# Patient Record
Sex: Male | Born: 1945 | Race: White | Hispanic: No | Marital: Married | Smoking: Former smoker
Health system: Southern US, Community
[De-identification: ages and names within clinical notes are randomized; demographics above are authoritative.]

## PROBLEM LIST (undated history)

## (undated) DIAGNOSIS — I213 ST elevation (STEMI) myocardial infarction of unspecified site: Secondary | ICD-10-CM

## (undated) DIAGNOSIS — R0602 Shortness of breath: Secondary | ICD-10-CM

## (undated) DIAGNOSIS — K5900 Constipation, unspecified: Secondary | ICD-10-CM

## (undated) DIAGNOSIS — M1990 Unspecified osteoarthritis, unspecified site: Secondary | ICD-10-CM

## (undated) DIAGNOSIS — R918 Other nonspecific abnormal finding of lung field: Secondary | ICD-10-CM

## (undated) DIAGNOSIS — I10 Essential (primary) hypertension: Secondary | ICD-10-CM

## (undated) DIAGNOSIS — Z87891 Personal history of nicotine dependence: Secondary | ICD-10-CM

## (undated) DIAGNOSIS — M545 Low back pain, unspecified: Secondary | ICD-10-CM

## (undated) DIAGNOSIS — R05 Cough: Secondary | ICD-10-CM

## (undated) DIAGNOSIS — I219 Acute myocardial infarction, unspecified: Secondary | ICD-10-CM

## (undated) DIAGNOSIS — J439 Emphysema, unspecified: Secondary | ICD-10-CM

## (undated) DIAGNOSIS — J449 Chronic obstructive pulmonary disease, unspecified: Secondary | ICD-10-CM

## (undated) DIAGNOSIS — E785 Hyperlipidemia, unspecified: Secondary | ICD-10-CM

## (undated) DIAGNOSIS — Z9889 Other specified postprocedural states: Secondary | ICD-10-CM

## (undated) DIAGNOSIS — R059 Cough, unspecified: Secondary | ICD-10-CM

## (undated) HISTORY — DX: ST elevation (STEMI) myocardial infarction of unspecified site: I21.3

## (undated) HISTORY — DX: Cough: R05

## (undated) HISTORY — DX: Cough, unspecified: R05.9

## (undated) HISTORY — PX: CARDIAC CATHETERIZATION: SHX172

## (undated) HISTORY — DX: Low back pain: M54.5

## (undated) HISTORY — PX: CARDIAC SURGERY: SHX584

## (undated) HISTORY — DX: Constipation, unspecified: K59.00

## (undated) HISTORY — DX: Other nonspecific abnormal finding of lung field: R91.8

## (undated) HISTORY — DX: Other specified postprocedural states: Z98.890

## (undated) HISTORY — DX: Personal history of nicotine dependence: Z87.891

## (undated) HISTORY — DX: Low back pain, unspecified: M54.50

## (undated) HISTORY — DX: Unspecified osteoarthritis, unspecified site: M19.90

## (undated) HISTORY — DX: Shortness of breath: R06.02

## (undated) HISTORY — DX: Chronic obstructive pulmonary disease, unspecified: J44.9

## (undated) HISTORY — DX: Emphysema, unspecified: J43.9

---

## 2013-10-04 ENCOUNTER — Ambulatory Visit: Payer: Self-pay | Admitting: Family Medicine

## 2014-01-13 DIAGNOSIS — Z9889 Other specified postprocedural states: Secondary | ICD-10-CM | POA: Insufficient documentation

## 2015-05-17 ENCOUNTER — Ambulatory Visit (INDEPENDENT_AMBULATORY_CARE_PROVIDER_SITE_OTHER): Payer: BLUE CROSS/BLUE SHIELD

## 2015-05-17 ENCOUNTER — Ambulatory Visit: Payer: BLUE CROSS/BLUE SHIELD

## 2015-05-17 ENCOUNTER — Encounter: Payer: Self-pay | Admitting: *Deleted

## 2015-05-17 ENCOUNTER — Ambulatory Visit
Admission: EM | Admit: 2015-05-17 | Discharge: 2015-05-17 | Disposition: A | Discharge status: Home / Self Care | Payer: BLUE CROSS/BLUE SHIELD | Attending: Family Medicine | Admitting: Family Medicine

## 2015-05-17 DIAGNOSIS — J984 Other disorders of lung: Secondary | ICD-10-CM

## 2015-05-17 DIAGNOSIS — I252 Old myocardial infarction: Secondary | ICD-10-CM | POA: Insufficient documentation

## 2015-05-17 DIAGNOSIS — M5442 Lumbago with sciatica, left side: Secondary | ICD-10-CM | POA: Insufficient documentation

## 2015-05-17 DIAGNOSIS — Z7982 Long term (current) use of aspirin: Secondary | ICD-10-CM | POA: Insufficient documentation

## 2015-05-17 DIAGNOSIS — R042 Hemoptysis: Secondary | ICD-10-CM

## 2015-05-17 DIAGNOSIS — Z79899 Other long term (current) drug therapy: Secondary | ICD-10-CM | POA: Diagnosis not present

## 2015-05-17 DIAGNOSIS — R918 Other nonspecific abnormal finding of lung field: Secondary | ICD-10-CM | POA: Diagnosis not present

## 2015-05-17 DIAGNOSIS — I10 Essential (primary) hypertension: Secondary | ICD-10-CM | POA: Diagnosis not present

## 2015-05-17 DIAGNOSIS — R079 Chest pain, unspecified: Secondary | ICD-10-CM | POA: Diagnosis present

## 2015-05-17 DIAGNOSIS — E785 Hyperlipidemia, unspecified: Secondary | ICD-10-CM | POA: Insufficient documentation

## 2015-05-17 DIAGNOSIS — M5441 Lumbago with sciatica, right side: Secondary | ICD-10-CM

## 2015-05-17 DIAGNOSIS — M549 Dorsalgia, unspecified: Secondary | ICD-10-CM | POA: Diagnosis present

## 2015-05-17 DIAGNOSIS — Z87891 Personal history of nicotine dependence: Secondary | ICD-10-CM | POA: Insufficient documentation

## 2015-05-17 HISTORY — DX: Essential (primary) hypertension: I10

## 2015-05-17 HISTORY — DX: Hyperlipidemia, unspecified: E78.5

## 2015-05-17 HISTORY — DX: Acute myocardial infarction, unspecified: I21.9

## 2015-05-17 MED ORDER — CYCLOBENZAPRINE HCL 10 MG PO TABS: 10.0000 mg | ORAL_TABLET | Freq: Every day | ORAL | Status: DC

## 2015-05-17 MED ORDER — HYDROCODONE-ACETAMINOPHEN 5-325 MG PO TABS: ORAL_TABLET | ORAL | Status: DC

## 2015-05-17 MED ORDER — CLONIDINE HCL 0.2 MG PO TABS
0.2000 mg | ORAL_TABLET | Freq: Once | ORAL | Status: AC
  Administered 2015-05-17: 0.2 mg via ORAL

## 2015-05-17 NOTE — ED Notes (Signed)
Patient scheduled to see Dr. Raul Del, Pulmonologist, on Feb. 23 Thursday at 10:15am at [facility].

## 2015-05-17 NOTE — ED Notes (Signed)
Patient started having symptoms of lower back pain 3 weeks ago and chest pain occurred last PM. Additional symptoms of nausea and vomiting occurred this AM. Back pain occurred while at work, but patient did not report the incident.

## 2015-05-17 NOTE — ED Provider Notes (Signed)
CSN: [id number]     Arrival date & time 05/17/15  40 History   First MD Initiated Contact with Patient 05/17/15 1117     Chief Complaint  Patient presents with  . Back Pain  . Nausea  . Chest Pain   (Consider location/radiation/quality/duration/timing/severity/associated sxs/prior Treatment) HPI Comments: 70 yo male with a c/o low back pain for 3 weeks, right sided chest pain last night associated with movement and vomiting this morning. Also states that for the past 5 months he's had a chronic cough with intermittent coughing up of small amounts of blood. States quit smoking around that time, but had a 50 year h/o smoking. Denies any fevers, chills, shortness of breath, palpitations.  The history is provided by the patient.    Past Medical History  Diagnosis Date  . Hypertension   . MI (myocardial infarction) ([place])     x2  . Hyperlipidemia    Past Surgical History  Procedure Laterality Date  . Cardiac surgery      stents placed x2   History reviewed. No pertinent family history. Social History  Substance Use Topics  . Smoking status: Former Research scientist (life sciences)  . Smokeless tobacco: Never Used  . Alcohol Use: No    Review of Systems  Allergies  Lisinopril  Home Medications   Prior to Admission medications   Medication Sig Start Date End Date Taking? Authorizing Provider  aspirin 81 MG tablet Take 81 mg by mouth daily.   Yes Historical Provider, MD  losartan (COZAAR) 50 MG tablet Take 50 mg by mouth daily.   Yes Historical Provider, MD  metoprolol (LOPRESSOR) 50 MG tablet Take 50 mg by mouth 2 (two) times daily.   Yes Historical Provider, MD  cyclobenzaprine (FLEXERIL) 10 MG tablet Take 1 tablet (10 mg total) by mouth at bedtime. 05/17/15   Norval Gable, MD  HYDROcodone-acetaminophen (NORCO/VICODIN) 5-325 MG tablet 1-2 tabs po  q 8 hours prn pain 05/17/15   Norval Gable, MD   Meds Ordered and Administered this Visit   Medications  cloNIDine (CATAPRES) tablet 0.2 mg (0.2 mg  Oral Given 05/17/15 1231)    BP 168/88 mmHg  Pulse 74  Temp(Src) 97.9 F (36.6 C) (Oral)  Resp 18  Ht 6' (1.829 m)  Wt 175 lb (79.379 kg)  BMI 23.73 kg/m2  SpO2 99% No data found.   Physical Exam  Constitutional: He appears well-developed and well-nourished. No distress.  HENT:  Head: Normocephalic and atraumatic.  Right Ear: External ear normal.  Left Ear: External ear normal.  Nose: Nose normal.  Mouth/Throat: Uvula is midline, oropharynx is clear and moist and mucous membranes are normal. No oropharyngeal exudate or tonsillar abscesses.  Eyes: Conjunctivae and EOM are normal. Pupils are equal, round, and reactive to light. Right eye exhibits no discharge. Left eye exhibits no discharge. No scleral icterus.  Neck: Normal range of motion. Neck supple. No tracheal deviation present. No thyromegaly present.  Cardiovascular: Normal rate, regular rhythm and normal heart sounds.   Pulmonary/Chest: Effort normal and breath sounds normal. No stridor. No respiratory distress. He has no wheezes. He has no rales. He exhibits tenderness.  Musculoskeletal:       Lumbar back: He exhibits tenderness and bony tenderness. He exhibits no swelling, no edema, no deformity, no laceration, no spasm and normal pulse.  Lymphadenopathy:    He has no cervical adenopathy.  Neurological: He is alert.  Skin: Skin is warm and dry. No rash noted. He is not diaphoretic.  Nursing note  and vitals reviewed.   ED Course  Procedures (including critical care time)  Labs Review Labs Reviewed - No data to display  Imaging Review Dg Chest 2 View  05/17/2015  CLINICAL DATA:  71 year old male with productive cough and hemoptysis since September 2016. EXAM: CHEST  2 VIEW COMPARISON:  None. FINDINGS: Prominent chronic obstructive pulmonary changes. Masslike consolidation right middle lobe. Malignancy is of concern. CT chest recommended for further delineation. Mild degenerative changes lower thoracic spine.  IMPRESSION: Prominent chronic obstructive pulmonary changes. Masslike consolidation right middle lobe. Malignancy is of concern. CT chest recommended for further delineation. These results will be called to the ordering clinician or representative by the Radiologist Assistant, and communication documented in the PACS or zVision Dashboard. Electronically Signed   By: Genia Del M.D.   On: 05/17/2015 12:36   Dg Lumbar Spine Complete  05/17/2015  CLINICAL DATA:  70 year old male with low back pain left side extending into both legs. Initial encounter. EXAM: LUMBAR SPINE - COMPLETE 4+ VIEW COMPARISON:  None. FINDINGS: Straightening of the lumbar spine. Mild disc space narrowing and minimal Schmorl's node deformity L1-2 and L2-3. L3-4 facet degenerative changes. Minimal anterior slip L3. Minimal disc space narrowing and Schmorl's node deformity L3-4. Marked L4-5 disc space narrowing with broad-based osteophyte. Facet degenerative changes. L5-S1 moderate to marked disc space narrowing with broad-based osteophyte. Facet degenerative changes. Vascular calcifications. IMPRESSION: Marked L4-5 disc space narrowing with broad-based osteophyte. Facet degenerative changes. L5-S1 moderate to marked disc space narrowing with broad-based osteophyte. Facet degenerative changes. L3-4 facet degenerative changes. Minimal anterior slip L3. Minimal disc space narrowing and Schmorl's node deformity L3-4. Mild disc space narrowing and minimal Schmorl's node deformity L1-2 and L2-3. Electronically Signed   By: Genia Del M.D.   On: 05/17/2015 12:13     Visual Acuity Review  Right Eye Distance:   Left Eye Distance:   Bilateral Distance:    Right Eye Near:   Left Eye Near:    Bilateral Near:      EKG: normal EKG, normal sinus rhythm, there are no previous tracings available for comparison; reviewed by me and agree.  MDM   1. Bilateral low back pain with sciatica, sciatica laterality unspecified   2. Hemoptysis    3. Mass of middle lobe of right lung    Discharge Medication List as of 05/17/2015  1:20 PM    START taking these medications   Details  cyclobenzaprine (FLEXERIL) 10 MG tablet Take 1 tablet (10 mg total) by mouth at bedtime., Starting 05/17/2015, Until Discontinued, Normal    HYDROcodone-acetaminophen (NORCO/VICODIN) 5-325 MG tablet 1-2 tabs po  q 8 hours prn pain, Print       1. x-ray results and diagnosis reviewed with patient 2. rx as per orders above; reviewed possible side effects, interactions, risks and benefits  3. Follow-up with pulmonologist (appointment scheduled for this week, Thursday)   Norval Gable, MD 05/17/15 2134

## 2015-05-20 ENCOUNTER — Other Ambulatory Visit: Payer: Self-pay | Admitting: Specialist

## 2015-05-20 DIAGNOSIS — R918 Other nonspecific abnormal finding of lung field: Secondary | ICD-10-CM

## 2015-05-24 ENCOUNTER — Ambulatory Visit
Admission: RE | Admit: 2015-05-24 | Discharge: 2015-05-24 | Disposition: A | Discharge status: Home / Self Care | Payer: BLUE CROSS/BLUE SHIELD | Source: Ambulatory Visit | Attending: Specialist | Admitting: Specialist

## 2015-05-24 DIAGNOSIS — R918 Other nonspecific abnormal finding of lung field: Secondary | ICD-10-CM

## 2015-05-24 DIAGNOSIS — R0602 Shortness of breath: Secondary | ICD-10-CM | POA: Diagnosis present

## 2015-05-24 DIAGNOSIS — J90 Pleural effusion, not elsewhere classified: Secondary | ICD-10-CM | POA: Insufficient documentation

## 2015-05-24 DIAGNOSIS — J438 Other emphysema: Secondary | ICD-10-CM | POA: Insufficient documentation

## 2015-05-24 DIAGNOSIS — E279 Disorder of adrenal gland, unspecified: Secondary | ICD-10-CM | POA: Insufficient documentation

## 2015-05-27 ENCOUNTER — Other Ambulatory Visit: Payer: Self-pay | Admitting: Specialist

## 2015-05-27 DIAGNOSIS — R918 Other nonspecific abnormal finding of lung field: Secondary | ICD-10-CM

## 2015-05-31 ENCOUNTER — Ambulatory Visit: Admission: RE | Admit: 2015-05-31 | Payer: 59 | Source: Ambulatory Visit

## 2015-06-02 ENCOUNTER — Ambulatory Visit: Admission: RE | Admit: 2015-06-02 | Payer: 59 | Source: Ambulatory Visit

## 2015-06-03 ENCOUNTER — Ambulatory Visit: Payer: BLUE CROSS/BLUE SHIELD | Admitting: Cardiothoracic Surgery

## 2015-06-03 ENCOUNTER — Inpatient Hospital Stay: Payer: 59 | Admitting: Internal Medicine

## 2015-06-04 ENCOUNTER — Ambulatory Visit
Admission: RE | Admit: 2015-06-04 | Discharge: 2015-06-04 | Disposition: A | Discharge status: Home / Self Care | Payer: 59 | Source: Ambulatory Visit | Attending: Specialist | Admitting: Specialist

## 2015-06-04 ENCOUNTER — Ambulatory Visit: Payer: BLUE CROSS/BLUE SHIELD

## 2015-06-04 DIAGNOSIS — C7951 Secondary malignant neoplasm of bone: Secondary | ICD-10-CM | POA: Diagnosis not present

## 2015-06-04 DIAGNOSIS — R918 Other nonspecific abnormal finding of lung field: Secondary | ICD-10-CM | POA: Diagnosis present

## 2015-06-04 DIAGNOSIS — C3490 Malignant neoplasm of unspecified part of unspecified bronchus or lung: Secondary | ICD-10-CM | POA: Insufficient documentation

## 2015-06-04 DIAGNOSIS — J90 Pleural effusion, not elsewhere classified: Secondary | ICD-10-CM | POA: Insufficient documentation

## 2015-06-04 DIAGNOSIS — I2510 Atherosclerotic heart disease of native coronary artery without angina pectoris: Secondary | ICD-10-CM | POA: Diagnosis not present

## 2015-06-04 LAB — GLUCOSE, CAPILLARY: GLUCOSE-CAPILLARY: 121 mg/dL — AB (ref 65–99)

## 2015-06-04 MED ORDER — FLUDEOXYGLUCOSE F - 18 (FDG) INJECTION
12.5800 | Freq: Once | INTRAVENOUS | Status: AC | PRN
  Administered 2015-06-04: 12.58 via INTRAVENOUS

## 2015-06-09 ENCOUNTER — Encounter: Payer: Self-pay | Admitting: *Deleted

## 2015-06-09 DIAGNOSIS — Z720 Tobacco use: Secondary | ICD-10-CM | POA: Insufficient documentation

## 2015-06-09 DIAGNOSIS — I10 Essential (primary) hypertension: Secondary | ICD-10-CM | POA: Insufficient documentation

## 2015-06-10 ENCOUNTER — Inpatient Hospital Stay: Payer: 59 | Attending: Internal Medicine | Admitting: Internal Medicine

## 2015-06-10 ENCOUNTER — Inpatient Hospital Stay: Payer: 59

## 2015-06-10 ENCOUNTER — Encounter: Payer: Self-pay | Admitting: Internal Medicine

## 2015-06-10 VITALS — BP 146/78 | HR 99 | Temp 98.6°F | Resp 24 | Ht 72.0 in | Wt 161.9 lb

## 2015-06-10 DIAGNOSIS — G4700 Insomnia, unspecified: Secondary | ICD-10-CM | POA: Diagnosis not present

## 2015-06-10 DIAGNOSIS — R200 Anesthesia of skin: Secondary | ICD-10-CM | POA: Insufficient documentation

## 2015-06-10 DIAGNOSIS — I252 Old myocardial infarction: Secondary | ICD-10-CM | POA: Diagnosis not present

## 2015-06-10 DIAGNOSIS — E279 Disorder of adrenal gland, unspecified: Secondary | ICD-10-CM | POA: Insufficient documentation

## 2015-06-10 DIAGNOSIS — J449 Chronic obstructive pulmonary disease, unspecified: Secondary | ICD-10-CM | POA: Diagnosis not present

## 2015-06-10 DIAGNOSIS — J90 Pleural effusion, not elsewhere classified: Secondary | ICD-10-CM

## 2015-06-10 DIAGNOSIS — C7931 Secondary malignant neoplasm of brain: Secondary | ICD-10-CM | POA: Insufficient documentation

## 2015-06-10 DIAGNOSIS — Z87891 Personal history of nicotine dependence: Secondary | ICD-10-CM | POA: Insufficient documentation

## 2015-06-10 DIAGNOSIS — M25552 Pain in left hip: Secondary | ICD-10-CM | POA: Insufficient documentation

## 2015-06-10 DIAGNOSIS — C3431 Malignant neoplasm of lower lobe, right bronchus or lung: Secondary | ICD-10-CM | POA: Diagnosis present

## 2015-06-10 DIAGNOSIS — Z79899 Other long term (current) drug therapy: Secondary | ICD-10-CM | POA: Insufficient documentation

## 2015-06-10 DIAGNOSIS — R042 Hemoptysis: Secondary | ICD-10-CM | POA: Insufficient documentation

## 2015-06-10 DIAGNOSIS — E785 Hyperlipidemia, unspecified: Secondary | ICD-10-CM | POA: Insufficient documentation

## 2015-06-10 DIAGNOSIS — R202 Paresthesia of skin: Secondary | ICD-10-CM | POA: Insufficient documentation

## 2015-06-10 DIAGNOSIS — R0602 Shortness of breath: Secondary | ICD-10-CM | POA: Insufficient documentation

## 2015-06-10 DIAGNOSIS — R918 Other nonspecific abnormal finding of lung field: Secondary | ICD-10-CM | POA: Diagnosis not present

## 2015-06-10 DIAGNOSIS — I10 Essential (primary) hypertension: Secondary | ICD-10-CM | POA: Insufficient documentation

## 2015-06-10 DIAGNOSIS — C7951 Secondary malignant neoplasm of bone: Secondary | ICD-10-CM | POA: Diagnosis not present

## 2015-06-10 DIAGNOSIS — Z7982 Long term (current) use of aspirin: Secondary | ICD-10-CM | POA: Insufficient documentation

## 2015-06-10 DIAGNOSIS — K5900 Constipation, unspecified: Secondary | ICD-10-CM | POA: Insufficient documentation

## 2015-06-10 DIAGNOSIS — I2510 Atherosclerotic heart disease of native coronary artery without angina pectoris: Secondary | ICD-10-CM | POA: Diagnosis not present

## 2015-06-10 DIAGNOSIS — G893 Neoplasm related pain (acute) (chronic): Secondary | ICD-10-CM | POA: Insufficient documentation

## 2015-06-10 DIAGNOSIS — M899 Disorder of bone, unspecified: Secondary | ICD-10-CM | POA: Insufficient documentation

## 2015-06-10 DIAGNOSIS — C7989 Secondary malignant neoplasm of other specified sites: Secondary | ICD-10-CM | POA: Diagnosis not present

## 2015-06-10 LAB — CBC WITH DIFFERENTIAL/PLATELET
BASOS PCT: 0 %
Basophils Absolute: 0 10*3/uL (ref 0–0.1)
Eosinophils Absolute: 1.5 10*3/uL — ABNORMAL HIGH (ref 0–0.7)
Eosinophils Relative: 4 %
HEMATOCRIT: 40.4 % (ref 40.0–52.0)
HEMOGLOBIN: 13 g/dL (ref 13.0–18.0)
LYMPHS ABS: 2.5 10*3/uL (ref 1.0–3.6)
LYMPHS PCT: 7 %
MCH: 29.5 pg (ref 26.0–34.0)
MCHC: 32.2 g/dL (ref 32.0–36.0)
MCV: 91.4 fL (ref 80.0–100.0)
MONOS PCT: 7 %
Monocytes Absolute: 2.5 10*3/uL — ABNORMAL HIGH (ref 0.2–1.0)
NEUTROS ABS: 29.9 10*3/uL — AB (ref 1.4–6.5)
Neutrophils Relative %: 82 %
Platelets: 459 10*3/uL — ABNORMAL HIGH (ref 150–440)
RBC: 4.41 MIL/uL (ref 4.40–5.90)
RDW: 14.9 % — AB (ref 11.5–14.5)
WBC: 36.4 10*3/uL — ABNORMAL HIGH (ref 3.8–10.6)

## 2015-06-10 LAB — APTT: aPTT: 27 seconds (ref 24–36)

## 2015-06-10 LAB — COMPREHENSIVE METABOLIC PANEL
ALBUMIN: 3.1 g/dL — AB (ref 3.5–5.0)
ALK PHOS: 98 U/L (ref 38–126)
ALT: 19 U/L (ref 17–63)
ANION GAP: 9 (ref 5–15)
AST: 16 U/L (ref 15–41)
BILIRUBIN TOTAL: 0.4 mg/dL (ref 0.3–1.2)
BUN: 23 mg/dL — AB (ref 6–20)
CO2: 26 mmol/L (ref 22–32)
Calcium: 9 mg/dL (ref 8.9–10.3)
Chloride: 98 mmol/L — ABNORMAL LOW (ref 101–111)
Creatinine, Ser: 1.08 mg/dL (ref 0.61–1.24)
GFR calc Af Amer: >60 mL/min (ref >60)
GFR calc non Af Amer: >60 mL/min (ref >60)
GLUCOSE: 94 mg/dL (ref 65–99)
POTASSIUM: 4.1 mmol/L (ref 3.5–5.1)
SODIUM: 133 mmol/L — AB (ref 135–145)
TOTAL PROTEIN: 7.3 g/dL (ref 6.5–8.1)

## 2015-06-10 LAB — PROTIME-INR
INR: 1.09
Prothrombin Time: 14.3 seconds (ref 11.4–15.0)

## 2015-06-10 LAB — LACTATE DEHYDROGENASE: LDH: 189 U/L (ref 98–192)

## 2015-06-10 MED ORDER — FENTANYL 25 MCG/HR TD PT72: 25.0000 ug | MEDICATED_PATCH | TRANSDERMAL | Status: DC

## 2015-06-10 MED ORDER — HYDROCODONE-ACETAMINOPHEN 5-325 MG PO TABS: ORAL_TABLET | ORAL | Status: DC

## 2015-06-10 NOTE — Progress Notes (Signed)
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  male long-standing history of smoking was recently evaluated in urgent care for shortness of breath- with a chest x-ray showed right lower lobe lung mass. This led to further imaging with a CAT scan that showed/right lower lobe mass with multiple bone metastases/adrenal metastases. He has been referred to [place] for further evaluation and recommendations.  Patient states to have significant pain of his left hip radiating to the leg; patient rates the pain 6-8 a scale of 10.Marland Kitchen Denies any bowel or bladder incontinence. He has constipation./Narcotics. No nausea no vomiting. Poor appetite. No syncope and weight loss. Denies any headaches. Patient has mild tingling and numbness of his left lower extremity.   ROS: A complete 10 point review of system is done which is negative except mentioned above in history of present illness  MEDICAL HISTORY:  Past Medical History  Diagnosis Date  . Hypertension   . MI (myocardial infarction) ([place])     x2 (2012 and 2014) at [facility]  . Hyperlipidemia   . Lung mass   . COPD (chronic obstructive pulmonary disease) ([place])   . Cough   . Emphysema of lung ([place])   . Shortness of breath   . Constipation   . Arthritis   . Low back pain   . ST elevation myocardial infarction (STEMI) ([place])   . History of tobacco abuse   . Hx of cardiac cath     SURGICAL HISTORY: Past Surgical History  Procedure Laterality Date  . Cardiac surgery      stents placed x2    SOCIAL HISTORY: Social History   Social History  . Marital Status: Married    Spouse Name: N/A  . Number of  Children: N/A  . Years of Education: N/A   Occupational History  . Not on file.   Social History Main Topics  . Smoking status: Former Smoker -- 1.00 packs/day for 50 years    Types: Cigarettes  . Smokeless tobacco: Never Used     Comment: "stopped 6 months ago" 2016  . Alcohol Use: No  . Drug Use: No  . Sexual Activity: Not on file   Other Topics Concern  . Not on file   Social History Narrative    FAMILY HISTORY: Family History  Problem Relation Age of Onset  . Hypertension    . Other Brother     "blood disorder"  . Heart failure Mother   . Heart disease Brother   . Heart disease Brother   . Heart disease      ALLERGIES:  is allergic to lisinopril.  MEDICATIONS:  Current Outpatient Prescriptions  Medication Sig Dispense Refill  . albuterol (PROAIR HFA) 108 (90 Base) MCG/ACT inhaler Inhale 2 Inhalers into the lungs every 6 (six) hours as needed. wheezing    . cyclobenzaprine (FLEXERIL) 10 MG tablet Take 1 tablet (10 mg total) by mouth at bedtime. 30 tablet 0  . HYDROcodone-acetaminophen (NORCO/VICODIN) 5-325 MG tablet 1 tab po  q  4-6 hours prn pain 60 tablet 0  . losartan (COZAAR) 50 MG tablet Take 50 mg by mouth daily.    Marland Kitchen  magnesium hydroxide (MILK OF MAGNESIA) 400 MG/5ML suspension Take 5 mLs by mouth daily as needed for mild constipation.    . metoprolol (LOPRESSOR) 50 MG tablet Take 50 mg by mouth 2 (two) times daily.    . NON FORMULARY Take 2 capsules by mouth daily.    Marland Kitchen omega-3 acid ethyl esters (LOVAZA) 1 g capsule Take 1 g by mouth daily.    . polyethylene glycol (MIRALAX / GLYCOLAX) packet Take 17 g by mouth daily as needed for mild constipation.    Marland Kitchen tiotropium (SPIRIVA) 18 MCG inhalation capsule Place 1 capsule into inhaler and inhale daily.    Marland Kitchen aspirin 81 MG tablet Take 81 mg by mouth daily. Reported on 06/10/2015    . fentaNYL (DURAGESIC - DOSED MCG/HR) 25 MCG/HR patch Place 1 patch (25 mcg total) onto the skin every 3 (three) days. 5 patch 0   No  current facility-administered medications for this visit.      Marland Kitchen  PHYSICAL EXAMINATION: ECOG PERFORMANCE STATUS: 1 - Symptomatic but completely ambulatory  Filed Vitals:   06/10/15 1615  BP: 146/78  Pulse: 99  Temp: 98.6 F (37 C)  Resp: 24   Filed Weights   06/10/15 1615  Weight: 161 lb 14.9 oz (73.45 kg)    GENERAL: Well-nourished well-developed; Alert, no distress and comfortable.   Accompanied by multiple family members including wife sister and son EYES: no pallor or icterus OROPHARYNX: no thrush or ulceration; good dentition  NECK: supple, no masses felt LYMPH:  no palpable lymphadenopathy in the cervical, axillary or inguinal regions LUNGS: Decreased breath sounds at the right lower base  No wheeze or crackles HEART/CVS: regular rate & rhythm and no murmurs; No lower extremity edema ABDOMEN: abdomen soft, non-tender and normal bowel sounds Musculoskeletal:no cyanosis of digits and no clubbing  PSYCH: alert & oriented x 3 with fluent speech NEURO: no focal motor/sensory deficits SKIN:  no rashes or significant lesions  LABORATORY DATA:  I have reviewed the data as listed No results found for: WBC, HGB, HCT, MCV, PLT No results for input(s): NA, K, CL, CO2, GLUCOSE, BUN, CREATININE, CALCIUM, GFRNONAA, GFRAA, PROT, ALBUMIN, AST, ALT, ALKPHOS, BILITOT, BILIDIR, IBILI in the last 8760 hours.  RADIOGRAPHIC STUDIES: I have personally reviewed the radiological images as listed and agreed with the findings in the report. Dg Chest 2 View  05/17/2015  CLINICAL DATA:  70 year old male with productive cough and hemoptysis since September 2016. EXAM: CHEST  2 VIEW COMPARISON:  None. FINDINGS: Prominent chronic obstructive pulmonary changes. Masslike consolidation right middle lobe. Malignancy is of concern. CT chest recommended for further delineation. Mild degenerative changes lower thoracic spine. IMPRESSION: Prominent chronic obstructive pulmonary changes. Masslike  consolidation right middle lobe. Malignancy is of concern. CT chest recommended for further delineation. These results will be called to the ordering clinician or representative by the Radiologist Assistant, and communication documented in the PACS or zVision Dashboard. Electronically Signed   By: Genia Del M.D.   On: 05/17/2015 12:36   Dg Lumbar Spine Complete  05/17/2015  CLINICAL DATA:  70 year old male with low back pain left side extending into both legs. Initial encounter. EXAM: LUMBAR SPINE - COMPLETE 4+ VIEW COMPARISON:  None. FINDINGS: Straightening of the lumbar spine. Mild disc space narrowing and minimal Schmorl's node deformity L1-2 and L2-3. L3-4 facet degenerative changes. Minimal anterior slip L3. Minimal disc space narrowing and Schmorl's node deformity L3-4. Marked L4-5 disc space narrowing with broad-based osteophyte. Facet degenerative changes. L5-S1 moderate to  marked disc space narrowing with broad-based osteophyte. Facet degenerative changes. Vascular calcifications. IMPRESSION: Marked L4-5 disc space narrowing with broad-based osteophyte. Facet degenerative changes. L5-S1 moderate to marked disc space narrowing with broad-based osteophyte. Facet degenerative changes. L3-4 facet degenerative changes. Minimal anterior slip L3. Minimal disc space narrowing and Schmorl's node deformity L3-4. Mild disc space narrowing and minimal Schmorl's node deformity L1-2 and L2-3. Electronically Signed   By: Genia Del M.D.   On: 05/17/2015 12:13   Ct Chest Wo Contrast  05/24/2015  CLINICAL DATA:  Shortness of breath. Severe COPD. Hemoptysis. Right lung mass seen on chest radiograph. EXAM: CT CHEST WITHOUT CONTRAST TECHNIQUE: Multidetector CT imaging of the chest was performed following the standard protocol without IV contrast. COMPARISON:  Chest radiograph on 05/17/2015 FINDINGS: Mediastinum/Lymph Nodes: No masses or pathologically enlarged lymph nodes identified on this un-enhanced exam. Heart  size is normal. Mild right-sided pericardial thickening versus tiny pericardial effusion also demonstrated. Lungs/Pleura: Severe bullous emphysema demonstrated. Large mass seen in the right middle lobe which abuts the right heart border as well as the anterior chest wall. This measures 8.3 x 7.7 cm on image 45/series 2, consistent with primary bronchogenic carcinoma. A tiny right pleural effusion is noted. Upper abdomen: 2.8 x 5.0 cm right adrenal mass is seen, highly suspicious for adrenal metastasis. Left adrenal gland is normal in appearance. Musculoskeletal: No chest wall mass or suspicious bone lesions identified. IMPRESSION: 8.3 cm right middle lobe mass which abuts the right heart border and anterior chest wall, highly suspicious for primary bronchogenic carcinoma. 5 cm right adrenal mass, consistent with metastatic disease. Tiny right pleural effusion, likely malignant. Mild right-sided pericardial thickening versus tiny pericardial effusion. Severe bullous emphysema. Electronically Signed   By: Earle Gell M.D.   On: 05/24/2015 15:25   Nm Pet Image Initial (pi) Skull Base To Thigh  06/04/2015  CLINICAL DATA:  Initial treatment strategy for lung mass. EXAM: NUCLEAR MEDICINE PET SKULL BASE TO THIGH TECHNIQUE: 12.6 mCi F-18 FDG was injected intravenously. Full-ring PET imaging was performed from the skull base to thigh after the radiotracer. CT data was obtained and used for attenuation correction and anatomic localization. FASTING BLOOD GLUCOSE:  Value: 121 mg/dl COMPARISON:  Chest CT of 05/24/2015. FINDINGS: NECK No areas of abnormal hypermetabolism. CHEST Right hilar hypermetabolism is without correlate adenopathy but suspicious. This measures a S.U.V. max of 3.3, including on image 101/series 3. The dominant right middle lobe lung mass is peripherally hypermetabolic and centrally necrotic. This measures 9.3 x 8.9 cm and a S.U.V. max of 21.1. ABDOMEN/PELVIS Hypermetabolism corresponding to the right  adrenal mass. 3.0 x 4.5 cm and a S.U.V. max of 10.2 on image 145/series 3. SKELETON Multifocal osseous metastasis. Examples within the a fifth posterior lateral right rib at a S.U.V. max of 7.4 on image 79/series 3, left side of the L4 vertebral body, at 6.9 x 3.5 cm and a S.U.V. max of 20.1, and right side of the sacrum at 3.1 x 3.0 cm and a S.U.V. max of 12.4. CT IMAGES PERFORMED FOR ATTENUATION CORRECTION No cervical adenopathy. Chest findings deferred to recent diagnostic CT. Right-sided pleural effusion is similar. Advanced centrilobular emphysema. Multivessel coronary artery atherosclerosis. Low-density left renal lesions which are likely cysts. Irregular hepatic capsule, including on image 149/series 3. Bilateral fat containing inguinal hernias. IMPRESSION: 1. 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 2. Soft tissue mass about the left-sided L4 vertebral body. Consider further evaluation with pre  and post contrast lumbar spine MRI to evaluate for extension into the canal. 3.  Atherosclerosis, including within the coronary arteries. 4. Small right pleural effusion. 5. Suspicion of cirrhosis. Electronically Signed   By: Abigail Miyamoto M.D.   On: 06/04/2015 11:01    ASSESSMENT & PLAN:   # Right lower lobe lung mass- with multiple lesions noted in the bones/ adrenal metastases/ soft tissue metastasis of the L4 vertebral body. 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 Would recommend tissue diagnosis. Spoke to radiologist; L4 vertebral soft tissue lesion most amenable to biopsy. This has been ordered.  # Recommend chemotherapy- after tissue diagnosis only for palliative reasons. Patient might be candidate for immunotherapy if PDL 1 is greater than 50%.  # Pain control- recommend fentanyl 25 g; hydrocodone 5/325 every 4-6 hours. New prescription given. Discussed the potential problems of constipation and help patient with narcotics.  Recommend increased fluid intake/stool softener MiraLAX if needed. Patient will also benefit from palliative radiation-for pain control. Refer the patient to radiation oncology.   # I reviewed the images myself. The images with the patient and family in detail. Today would recommend CBC CMP PT PTT LDH. Also recommend- MRI of the brain to rule out any metastases.  # I will plan to see the patient back in 1 week; plan X-geva at that time.   Thank you Dr. Raul Del for allowing me to participate in the care of your pleasant patient. Please do not hesitate to contact me with questions or concerns in the interim.  # 60 minutes face-to-face with the patient discussing the above plan of care; more than 50% of time spent on prognosis/ natural history; counseling and coordination.     Cammie Sickle, MD 06/10/2015 4:49 PM

## 2015-06-11 ENCOUNTER — Ambulatory Visit
Admission: RE | Admit: 2015-06-11 | Discharge: 2015-06-11 | Disposition: A | Discharge status: Home / Self Care | Payer: 59 | Source: Ambulatory Visit | Attending: Internal Medicine | Admitting: Internal Medicine

## 2015-06-11 ENCOUNTER — Telehealth: Payer: Self-pay | Admitting: *Deleted

## 2015-06-11 ENCOUNTER — Other Ambulatory Visit: Payer: Self-pay | Admitting: Internal Medicine

## 2015-06-11 ENCOUNTER — Ambulatory Visit
Admission: RE | Admit: 2015-06-11 | Discharge: 2015-06-11 | Disposition: A | Discharge status: Home / Self Care | Payer: 59 | Source: Ambulatory Visit | Attending: Radiation Oncology | Admitting: Radiation Oncology

## 2015-06-11 ENCOUNTER — Encounter: Payer: Self-pay | Admitting: Radiation Oncology

## 2015-06-11 VITALS — BP 149/87 | HR 117 | Temp 97.0°F | Resp 22 | Wt 163.4 lb

## 2015-06-11 DIAGNOSIS — R0602 Shortness of breath: Secondary | ICD-10-CM | POA: Insufficient documentation

## 2015-06-11 DIAGNOSIS — I252 Old myocardial infarction: Secondary | ICD-10-CM | POA: Diagnosis not present

## 2015-06-11 DIAGNOSIS — E785 Hyperlipidemia, unspecified: Secondary | ICD-10-CM | POA: Insufficient documentation

## 2015-06-11 DIAGNOSIS — C342 Malignant neoplasm of middle lobe, bronchus or lung: Secondary | ICD-10-CM | POA: Diagnosis not present

## 2015-06-11 DIAGNOSIS — Z7982 Long term (current) use of aspirin: Secondary | ICD-10-CM | POA: Diagnosis not present

## 2015-06-11 DIAGNOSIS — Z510 Encounter for antineoplastic radiation therapy: Secondary | ICD-10-CM | POA: Insufficient documentation

## 2015-06-11 DIAGNOSIS — R531 Weakness: Secondary | ICD-10-CM | POA: Diagnosis not present

## 2015-06-11 DIAGNOSIS — K5900 Constipation, unspecified: Secondary | ICD-10-CM | POA: Insufficient documentation

## 2015-06-11 DIAGNOSIS — J439 Emphysema, unspecified: Secondary | ICD-10-CM | POA: Diagnosis not present

## 2015-06-11 DIAGNOSIS — M545 Low back pain: Secondary | ICD-10-CM | POA: Diagnosis not present

## 2015-06-11 DIAGNOSIS — C7931 Secondary malignant neoplasm of brain: Secondary | ICD-10-CM | POA: Diagnosis not present

## 2015-06-11 DIAGNOSIS — Z79899 Other long term (current) drug therapy: Secondary | ICD-10-CM | POA: Diagnosis not present

## 2015-06-11 DIAGNOSIS — M129 Arthropathy, unspecified: Secondary | ICD-10-CM | POA: Diagnosis not present

## 2015-06-11 DIAGNOSIS — C3431 Malignant neoplasm of lower lobe, right bronchus or lung: Secondary | ICD-10-CM | POA: Diagnosis present

## 2015-06-11 DIAGNOSIS — J449 Chronic obstructive pulmonary disease, unspecified: Secondary | ICD-10-CM | POA: Diagnosis not present

## 2015-06-11 DIAGNOSIS — Z87891 Personal history of nicotine dependence: Secondary | ICD-10-CM | POA: Insufficient documentation

## 2015-06-11 DIAGNOSIS — C7951 Secondary malignant neoplasm of bone: Secondary | ICD-10-CM | POA: Diagnosis present

## 2015-06-11 DIAGNOSIS — M7989 Other specified soft tissue disorders: Secondary | ICD-10-CM

## 2015-06-11 MED ORDER — GADOBENATE DIMEGLUMINE 529 MG/ML IV SOLN
15.0000 mL | Freq: Once | INTRAVENOUS | Status: AC | PRN
  Administered 2015-06-11: 15 mL via INTRAVENOUS

## 2015-06-11 NOTE — Telephone Encounter (Signed)
Sister in law called at 8am this morning to confirm that the MRI has been approved by insurance. States that the patient's pet scan took several days to approve, which previously delayed care. Family is worried that insurance will not approve scan. Pt must drive about 40 minutes to [place] and has not yet left home. Expressed concern about coming to scan if scan not approved. RN spoke with Elliot Gault in prior auth. Insurance approved MRI. Pt's sister in law notified of approval. She thanked me for returning her call.  At 1015 am I also received a phone call from Teresita in Human resources officer. L1 biopsy to be performed next Wednesday 06/16/15.  Th e patient needs to arrive at 1030am for a 1130am appointment. He needs to remain NPO 8 hrs prior to the procedure. Continue holding the aspirin as directed.  Spoke with pt's sister in law Georgianne Fick while pt was in clinic today. Teach back process performed with patient's sister in law.

## 2015-06-11 NOTE — Consult Note (Signed)
Except an outstanding is perfect of Radiation Oncology NEW PATIENT EVALUATION  Name: Mitchell Nguyen  MRN: [id number]  Date:   06/11/2015     DOB: 25-May-1945   This 70 y.o. male patient presents to the clinic for initial evaluation of stage IV lung cancer with significant disease in lumbar spine and sacrum.  REFERRING PHYSICIAN: No ref. provider found  CHIEF COMPLAINT:  Chief Complaint  Patient presents with  . Cancer    Pt is here for initial consultation of bone metastasis.      DIAGNOSIS: The encounter diagnosis was Bone metastasis ([place]).   PREVIOUS INVESTIGATIONS:  PET CT scan reviewed and CT scans reviewed Clinical notes reviewed Pathology report will be reviewed when available   HPI: Patient is a pleasant 70 year old male long-standing smoking history came to urgent care with increasing shortness of breath and lower back pain. Chest x-ray showed a right upper lobe mass. PET CT was performed showing hypermetabolic right upper lobe mass as well as significant hypermetabolic activity destroying his lumbar spine and sacrum. Patient is going to be scheduled for possible CT-guided biopsy early next week. I been asked to evaluate the patient for possible palliative radiation. He has an MRI scheduled of his lumbar spine to rule out the possibility of cord involvement. Tumor by PET CT is close to the cord and may be impinging. He is having slight weakness in his left lower extremity. Also pain does radiate to his left lower extremity.   PLANNED TREATMENT REGIMEN: Palliative radiation therapy to lumbar spine and sacrum  PAST MEDICAL HISTORY:  has a past medical history of Hypertension; MI (myocardial infarction) ([place]); Hyperlipidemia; Lung mass; COPD (chronic obstructive pulmonary disease) ([place]); Cough; Emphysema of lung ([place]); Shortness of breath; Constipation; Arthritis; Low back pain; ST elevation myocardial infarction (STEMI) ([place]); History of tobacco abuse; and cardiac cath.     PAST SURGICAL HISTORY:  Past Surgical History  Procedure Laterality Date  . Cardiac surgery      stents placed x2    FAMILY HISTORY: family history includes Heart disease in his brother and brother; Heart failure in his mother; Other in his brother.  SOCIAL HISTORY:  reports that he has quit smoking. His smoking use included Cigarettes. He has a 50 pack-year smoking history. He has never used smokeless tobacco. He reports that he does not drink alcohol or use illicit drugs.  ALLERGIES: Lisinopril  MEDICATIONS:  Current Outpatient Prescriptions  Medication Sig Dispense Refill  . albuterol (PROAIR HFA) 108 (90 Base) MCG/ACT inhaler Inhale 2 Inhalers into the lungs every 6 (six) hours as needed. wheezing    . aspirin 81 MG tablet Take 81 mg by mouth daily. Reported on 06/10/2015    . cyclobenzaprine (FLEXERIL) 10 MG tablet Take 1 tablet (10 mg total) by mouth at bedtime. 30 tablet 0  . fentaNYL (DURAGESIC - DOSED MCG/HR) 25 MCG/HR patch Place 1 patch (25 mcg total) onto the skin every 3 (three) days. 5 patch 0  . HYDROcodone-acetaminophen (NORCO/VICODIN) 5-325 MG tablet 1 tab po  q  4-6 hours prn pain 60 tablet 0  . losartan (COZAAR) 50 MG tablet Take 50 mg by mouth daily.    . magnesium hydroxide (MILK OF MAGNESIA) 400 MG/5ML suspension Take 5 mLs by mouth daily as needed for mild constipation.    . metoprolol (LOPRESSOR) 50 MG tablet Take 50 mg by mouth 2 (two) times daily.    . NON FORMULARY Take 2 capsules by mouth daily.    Marland Kitchen  omega-3 acid ethyl esters (LOVAZA) 1 g capsule Take 1 g by mouth daily.    . polyethylene glycol (MIRALAX / GLYCOLAX) packet Take 17 g by mouth daily as needed for mild constipation.    Marland Kitchen tiotropium (SPIRIVA) 18 MCG inhalation capsule Place 1 capsule into inhaler and inhale daily.     No current facility-administered medications for this encounter.    ECOG PERFORMANCE STATUS:  1 - Symptomatic but completely ambulatory  REVIEW OF SYSTEMS: Except for the  pain cough some decreased appetite Patient denies any weight loss, fatigue, weakness, fever, chills or night sweats. Patient denies any loss of vision, blurred vision. Patient denies any ringing  of the ears or hearing loss. No irregular heartbeat. Patient denies heart murmur or history of fainting. Patient denies any chest pain or pain radiating to her upper extremities. Patient denies any shortness of breath, difficulty breathing at night, cough or hemoptysis. Patient denies any swelling in the lower legs. Patient denies any nausea vomiting, vomiting of blood, or coffee ground material in the vomitus. Patient denies any stomach pain. Patient states has had normal bowel movements no significant constipation or diarrhea. Patient denies any dysuria, hematuria or significant nocturia. Patient denies any problems walking, swelling in the joints or loss of balance. Patient denies any skin changes, loss of hair or loss of weight. Patient denies any excessive worrying or anxiety or significant depression. Patient denies any problems with insomnia. Patient denies excessive thirst, polyuria, polydipsia. Patient denies any swollen glands, patient denies easy bruising or easy bleeding. Patient denies any recent infections, allergies or URI. Patient "s visual fields have not changed significantly in recent time.    PHYSICAL EXAM: BP 149/87 mmHg  Pulse 117  Temp(Src) 97 F (36.1 C)  Resp 22  Wt 163 lb 5.8 oz (74.1 kg) Well-developed male in NAD. Does have some slight decreased strength in his left lower extremity and may be some decreased proprioception. Right lower extremity shows no motor or sensory level. Well-developed well-nourished patient in NAD. HEENT reveals PERLA, EOMI, discs not visualized.  Oral cavity is clear. No oral mucosal lesions are identified. Neck is clear without evidence of cervical or supraclavicular adenopathy. Lungs are clear to A&P. Cardiac examination is essentially unremarkable with  regular rate and rhythm without murmur rub or thrill. Abdomen is benign with no organomegaly or masses noted. Motor sensory and DTR levels are equal and symmetric in the upper and lower extremities. Cranial nerves II through XII are grossly intact. Proprioception is intact. No peripheral adenopathy or edema is identified. No motor or sensory levels are noted. Crude visual fields are within normal range.  LABORATORY DATA: Pathology reports pending    RADIOLOGY RESULTS: CT and PET/CT scans reviewed compatible above-stated findings   IMPRESSION: Primary lung cancer as yet not biopsied with bone metastasis to lumbar spine and sacrum in 70 year old male  PLAN: At this time I'm with his simulate the patient for palliative radiation therapy to his lumbar spine and sacrum. Would plan on delivering 3000 cGy in 10 fractions. Risks and benefits of treatment including possible diarrhea , fatigue, alteration of blood counts, skin reaction all were described in detail to the patient. Patient and family seems to comprehend my treatment plan well I have personally ordered and scheduled CT simulation this morning. I've discussed the case personally with medical oncology. Patient may need palliative ration therapy to his chest since his is a large lung mass which may be amenable to some palliative radiation therapy after chemotherapy.  I would like to take this opportunity for allowing me to participate in the care of your patient.Roda Shutters Marye Eagen M.D.

## 2015-06-15 ENCOUNTER — Other Ambulatory Visit: Payer: Self-pay | Admitting: Radiology

## 2015-06-16 ENCOUNTER — Ambulatory Visit
Admission: RE | Admit: 2015-06-16 | Discharge: 2015-06-16 | Disposition: A | Discharge status: Home / Self Care | Payer: 59 | Source: Ambulatory Visit | Attending: Internal Medicine | Admitting: Internal Medicine

## 2015-06-16 ENCOUNTER — Ambulatory Visit
Admission: RE | Admit: 2015-06-16 | Discharge: 2015-06-16 | Disposition: A | Discharge status: Home / Self Care | Payer: 59 | Source: Ambulatory Visit | Attending: Radiation Oncology | Admitting: Radiation Oncology

## 2015-06-16 DIAGNOSIS — C7951 Secondary malignant neoplasm of bone: Secondary | ICD-10-CM | POA: Insufficient documentation

## 2015-06-16 DIAGNOSIS — C3431 Malignant neoplasm of lower lobe, right bronchus or lung: Secondary | ICD-10-CM | POA: Diagnosis present

## 2015-06-16 DIAGNOSIS — M7989 Other specified soft tissue disorders: Secondary | ICD-10-CM

## 2015-06-16 DIAGNOSIS — M799 Soft tissue disorder, unspecified: Secondary | ICD-10-CM | POA: Insufficient documentation

## 2015-06-16 MED ORDER — FENTANYL CITRATE (PF) 100 MCG/2ML IJ SOLN
INTRAMUSCULAR | Status: AC | PRN
  Administered 2015-06-16: 25 ug via INTRAVENOUS
  Administered 2015-06-16: 50 ug via INTRAVENOUS

## 2015-06-16 MED ORDER — HYDROCODONE-ACETAMINOPHEN 5-325 MG PO TABS
1.0000 | ORAL_TABLET | ORAL | Status: DC | PRN
  Filled 2015-06-16: qty 2

## 2015-06-16 MED ORDER — MIDAZOLAM HCL 5 MG/5ML IJ SOLN
INTRAMUSCULAR | Status: AC
  Filled 2015-06-16: qty 5

## 2015-06-16 MED ORDER — FENTANYL CITRATE (PF) 100 MCG/2ML IJ SOLN
INTRAMUSCULAR | Status: AC
  Filled 2015-06-16: qty 4

## 2015-06-16 MED ORDER — SODIUM CHLORIDE 0.9 % IV SOLN
Freq: Once | INTRAVENOUS | Status: AC
  Administered 2015-06-16: 11:00:00 via INTRAVENOUS

## 2015-06-16 MED ORDER — MIDAZOLAM HCL 5 MG/5ML IJ SOLN
INTRAMUSCULAR | Status: AC | PRN
  Administered 2015-06-16 (×2): 0.5 mg via INTRAVENOUS
  Administered 2015-06-16: 2 mg via INTRAVENOUS

## 2015-06-16 NOTE — Procedures (Signed)
Interventional Radiology Procedure Note  Procedure: CT guided bx of soft tissue mass in the left paraspinal muscles at L4  Complications: None  Estimated Blood Loss: None  Recommendations:  - Bedrest x 1 hr - Path is pending  Signed,  Criselda Peaches, MD

## 2015-06-17 ENCOUNTER — Ambulatory Visit
Admission: RE | Admit: 2015-06-17 | Discharge: 2015-06-17 | Disposition: A | Discharge status: Home / Self Care | Payer: 59 | Source: Ambulatory Visit | Attending: Radiation Oncology | Admitting: Radiation Oncology

## 2015-06-17 DIAGNOSIS — C7951 Secondary malignant neoplasm of bone: Secondary | ICD-10-CM | POA: Diagnosis not present

## 2015-06-18 ENCOUNTER — Inpatient Hospital Stay (HOSPITAL_BASED_OUTPATIENT_CLINIC_OR_DEPARTMENT_OTHER): Payer: 59 | Admitting: Internal Medicine

## 2015-06-18 ENCOUNTER — Ambulatory Visit
Admission: RE | Admit: 2015-06-18 | Discharge: 2015-06-18 | Disposition: A | Discharge status: Home / Self Care | Payer: 59 | Source: Ambulatory Visit | Attending: Radiation Oncology | Admitting: Radiation Oncology

## 2015-06-18 ENCOUNTER — Inpatient Hospital Stay: Payer: 59

## 2015-06-18 VITALS — BP 144/84 | HR 102 | Temp 97.9°F | Resp 22 | Ht 72.0 in | Wt 157.0 lb

## 2015-06-18 VITALS — BP 148/91 | HR 105 | Temp 98.0°F | Resp 24

## 2015-06-18 DIAGNOSIS — C7951 Secondary malignant neoplasm of bone: Secondary | ICD-10-CM

## 2015-06-18 DIAGNOSIS — Z87891 Personal history of nicotine dependence: Secondary | ICD-10-CM

## 2015-06-18 DIAGNOSIS — J449 Chronic obstructive pulmonary disease, unspecified: Secondary | ICD-10-CM

## 2015-06-18 DIAGNOSIS — C7989 Secondary malignant neoplasm of other specified sites: Secondary | ICD-10-CM | POA: Diagnosis not present

## 2015-06-18 DIAGNOSIS — R202 Paresthesia of skin: Secondary | ICD-10-CM

## 2015-06-18 DIAGNOSIS — R200 Anesthesia of skin: Secondary | ICD-10-CM

## 2015-06-18 DIAGNOSIS — E785 Hyperlipidemia, unspecified: Secondary | ICD-10-CM

## 2015-06-18 DIAGNOSIS — C342 Malignant neoplasm of middle lobe, bronchus or lung: Secondary | ICD-10-CM

## 2015-06-18 DIAGNOSIS — I2510 Atherosclerotic heart disease of native coronary artery without angina pectoris: Secondary | ICD-10-CM

## 2015-06-18 DIAGNOSIS — K5900 Constipation, unspecified: Secondary | ICD-10-CM

## 2015-06-18 DIAGNOSIS — C3431 Malignant neoplasm of lower lobe, right bronchus or lung: Secondary | ICD-10-CM

## 2015-06-18 DIAGNOSIS — C7931 Secondary malignant neoplasm of brain: Secondary | ICD-10-CM | POA: Diagnosis not present

## 2015-06-18 DIAGNOSIS — J90 Pleural effusion, not elsewhere classified: Secondary | ICD-10-CM

## 2015-06-18 DIAGNOSIS — M25552 Pain in left hip: Secondary | ICD-10-CM

## 2015-06-18 DIAGNOSIS — Z79899 Other long term (current) drug therapy: Secondary | ICD-10-CM

## 2015-06-18 DIAGNOSIS — G4700 Insomnia, unspecified: Secondary | ICD-10-CM

## 2015-06-18 DIAGNOSIS — I252 Old myocardial infarction: Secondary | ICD-10-CM

## 2015-06-18 DIAGNOSIS — Z7982 Long term (current) use of aspirin: Secondary | ICD-10-CM

## 2015-06-18 DIAGNOSIS — I10 Essential (primary) hypertension: Secondary | ICD-10-CM

## 2015-06-18 DIAGNOSIS — G893 Neoplasm related pain (acute) (chronic): Principal | ICD-10-CM

## 2015-06-18 MED ORDER — DEXAMETHASONE 4 MG PO TABS: ORAL_TABLET | ORAL | Status: AC

## 2015-06-18 MED ORDER — DENOSUMAB 120 MG/1.7ML ~~LOC~~ SOLN
120.0000 mg | Freq: Once | SUBCUTANEOUS | Status: AC
  Administered 2015-06-18: 120 mg via SUBCUTANEOUS
  Filled 2015-06-18: qty 1.7

## 2015-06-18 NOTE — Patient Instructions (Signed)
Denosumab injection  What is this medicine?  DENOSUMAB (den oh sue mab) slows bone breakdown. Prolia is used to treat osteoporosis in women after menopause and in men. Xgeva is used to prevent bone fractures and other bone problems caused by cancer bone metastases. Xgeva is also used to treat giant cell tumor of the bone.  This medicine may be used for other purposes; ask your health care provider or pharmacist if you have questions.  What should I tell my health care provider before I take this medicine?  They need to know if you have any of these conditions:  -dental disease  -eczema  -infection or history of infections  -kidney disease or on dialysis  -low blood calcium or vitamin D  -malabsorption syndrome  -scheduled to have surgery or tooth extraction  -taking medicine that contains denosumab  -thyroid or parathyroid disease  -an unusual reaction to denosumab, other medicines, foods, dyes, or preservatives  -pregnant or trying to get pregnant  -breast-feeding  How should I use this medicine?  This medicine is for injection under the skin. It is given by a health care professional in a hospital or clinic setting.  If you are getting Prolia, a special MedGuide will be given to you by the pharmacist with each prescription and refill. Be sure to read this information carefully each time.  For Prolia, talk to your pediatrician regarding the use of this medicine in children. Special care may be needed. For Xgeva, talk to your pediatrician regarding the use of this medicine in children. While this drug may be prescribed for children as young as 13 years for selected conditions, precautions do apply.  Overdosage: If you think you have taken too much of this medicine contact a poison control center or emergency room at once.  NOTE: This medicine is only for you. Do not share this medicine with others.  What if I miss a dose?  It is important not to miss your dose. Call your doctor or health care professional if you are  unable to keep an appointment.  What may interact with this medicine?  Do not take this medicine with any of the following medications:  -other medicines containing denosumab  This medicine may also interact with the following medications:  -medicines that suppress the immune system  -medicines that treat cancer  -steroid medicines like prednisone or cortisone  This list may not describe all possible interactions. Give your health care provider a list of all the medicines, herbs, non-prescription drugs, or dietary supplements you use. Also tell them if you smoke, drink alcohol, or use illegal drugs. Some items may interact with your medicine.  What should I watch for while using this medicine?  Visit your doctor or health care professional for regular checks on your progress. Your doctor or health care professional may order blood tests and other tests to see how you are doing.  Call your doctor or health care professional if you get a cold or other infection while receiving this medicine. Do not treat yourself. This medicine may decrease your body's ability to fight infection.  You should make sure you get enough calcium and vitamin D while you are taking this medicine, unless your doctor tells you not to. Discuss the foods you eat and the vitamins you take with your health care professional.  See your dentist regularly. Brush and floss your teeth as directed. Before you have any dental work done, tell your dentist you are receiving this medicine.  Do   not become pregnant while taking this medicine or for 5 months after stopping it. Women should inform their doctor if they wish to become pregnant or think they might be pregnant. There is a potential for serious side effects to an unborn child. Talk to your health care professional or pharmacist for more information.  What side effects may I notice from receiving this medicine?  Side effects that you should report to your doctor or health care professional as soon as  possible:  -allergic reactions like skin rash, itching or hives, swelling of the face, lips, or tongue  -breathing problems  -chest pain  -fast, irregular heartbeat  -feeling faint or lightheaded, falls  -fever, chills, or any other sign of infection  -muscle spasms, tightening, or twitches  -numbness or tingling  -skin blisters or bumps, or is dry, peels, or red  -slow healing or unexplained pain in the mouth or jaw  -unusual bleeding or bruising  Side effects that usually do not require medical attention (Report these to your doctor or health care professional if they continue or are bothersome.):  -muscle pain  -stomach upset, gas  This list may not describe all possible side effects. Call your doctor for medical advice about side effects. You may report side effects to FDA at 1-800-FDA-1088.  Where should I keep my medicine?  This medicine is only given in a clinic, doctor's office, or other health care setting and will not be stored at home.  NOTE: This sheet is a summary. It may not cover all possible information. If you have questions about this medicine, talk to your doctor, pharmacist, or health care provider.      2016, [employer]. (2011-09-11 12:37:47)

## 2015-06-18 NOTE — Progress Notes (Signed)
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  male with newly diagnosed likely right lung cancer with multiple metastases described above is here for follow-up/to review the results of his labs and workup. Patient had a biopsy of the sacral soft tissue 3 days ago.  Patient has difficulty sleeping at night mostly secondary to pain. Poor appetite. Intermittent hemoptysis. No nausea no vomiting. No headaches.   Patient continues to pain for which he is on the fentanyl patch and also hydrocodone every 4 hours.  No syncope and weight loss. Denies any headaches.   Patient started radiation yesterday.   ROS: A complete 10 point review of system is done which is negative except mentioned above in history of present illness  MEDICAL HISTORY:  Past Medical History  Diagnosis Date  . Hypertension   . MI (myocardial infarction) ([place])     x2 (2012 and 2014) at [facility]  . Hyperlipidemia   . Lung mass   . COPD (chronic obstructive pulmonary disease) ([place])   . Cough   . Emphysema of lung ([place])   . Shortness of breath   . Constipation   . Arthritis   . Low back pain   . ST elevation myocardial infarction (STEMI) ([place])   . History of tobacco abuse   . Hx of cardiac cath     SURGICAL HISTORY: Past Surgical History  Procedure Laterality Date  . Cardiac surgery      stents placed x2  . Cardiac catheterization      SOCIAL HISTORY: Social History   Social History  . Marital Status: Married    Spouse Name: N/A  . Number of Children: N/A  . Years of Education: N/A    Occupational History  . Not on file.   Social History Main Topics  . Smoking status: Former Smoker -- 1.00 packs/day for 50 years    Types: Cigarettes    Quit date: 11/26/2014  . Smokeless tobacco: Never Used     Comment: "stopped 6 months ago" 2016  . Alcohol Use: No  . Drug Use: No  . Sexual Activity: Not on file   Other Topics Concern  . Not on file   Social History Narrative    FAMILY HISTORY: Family History  Problem Relation Age of Onset  . Hypertension    . Other Brother     "blood disorder"  . Heart failure Mother   . Heart disease Brother   . Heart disease Brother   . Heart disease      ALLERGIES:  is allergic to lisinopril.  MEDICATIONS:  Current Outpatient Prescriptions  Medication Sig Dispense Refill  . albuterol (PROAIR HFA) 108 (90 Base) MCG/ACT inhaler Inhale 2 Inhalers into the lungs every 6 (six) hours as needed. wheezing    . fentaNYL (DURAGESIC - DOSED MCG/HR) 25 MCG/HR patch Place 1 patch (25 mcg total) onto the skin every 3 (three) days. 5 patch 0  . HYDROcodone-acetaminophen (NORCO/VICODIN) 5-325 MG tablet 1 tab po  q  4-6 hours prn pain 60 tablet 0  .  losartan (COZAAR) 50 MG tablet Take 50 mg by mouth daily.    . magnesium hydroxide (MILK OF MAGNESIA) 400 MG/5ML suspension Take 5 mLs by mouth daily as needed for mild constipation.    . metoprolol (LOPRESSOR) 50 MG tablet Take 50 mg by mouth daily.     . polyethylene glycol (MIRALAX / GLYCOLAX) packet Take 17 g by mouth daily as needed for mild constipation.    Marland Kitchen tiotropium (SPIRIVA) 18 MCG inhalation capsule Place 1 capsule into inhaler and inhale daily.    Marland Kitchen aspirin 81 MG tablet Take 81 mg by mouth daily. Reported on 06/18/2015    . dexamethasone (DECADRON) 4 MG tablet Take one pill with breakfast/lunch 20 tablet 0  . docusate sodium (COLACE) 100 MG capsule Take 100 mg by mouth 2 (two) times daily. Reported on 06/18/2015    . NON FORMULARY Take 2 capsules by mouth daily. Reported on 06/18/2015     . omega-3 acid ethyl esters (LOVAZA) 1 g capsule Take 1 g by mouth daily. Reported on 06/18/2015     No current facility-administered medications for this visit.      Marland Kitchen  PHYSICAL EXAMINATION: ECOG PERFORMANCE STATUS: 1 - Symptomatic but completely ambulatory  Filed Vitals:   06/18/15 0830 06/18/15 0851  BP: 144/84   Pulse: 102   Temp: 97.9 F (36.6 C)   Resp:  22   Filed Weights   06/18/15 0851  Weight: 156 lb 15.5 oz (71.2 kg)    GENERAL: Well-nourished well-developed; Alert, no distress and comfortable.   Accompanied by multiple family members including wife sister and son EYES: no pallor or icterus OROPHARYNX: no thrush or ulceration; good dentition  NECK: supple, no masses felt LYMPH:  no palpable lymphadenopathy in the cervical, axillary or inguinal regions LUNGS: Decreased breath sounds at the right lower base  No wheeze or crackles HEART/CVS: regular rate & rhythm and no murmurs; No lower extremity edema ABDOMEN: abdomen soft, non-tender and normal bowel sounds Musculoskeletal:no cyanosis of digits and no clubbing  PSYCH: alert & oriented x 3 with fluent speech NEURO: no focal motor/sensory deficits SKIN:  no rashes or significant lesions  LABORATORY DATA:  I have reviewed the data as listed Lab Results  Component Value Date   WBC 36.4* 06/10/2015   HGB 13.0 06/10/2015   HCT 40.4 06/10/2015   MCV 91.4 06/10/2015   PLT 459* 06/10/2015    Recent Labs  06/10/15 1642  NA 133*  K 4.1  CL 98*  CO2 26  GLUCOSE 94  BUN 23*  CREATININE 1.08  CALCIUM 9.0  GFRNONAA >60  GFRAA >60  PROT 7.3  ALBUMIN 3.1*  AST 16  ALT 19  ALKPHOS 98  BILITOT 0.4    RADIOGRAPHIC STUDIES: I have personally reviewed the radiological images as listed and agreed with the findings in the report. Ct Chest Wo Contrast  05/24/2015  CLINICAL DATA:  Shortness of breath. Severe COPD. Hemoptysis. Right lung mass seen on chest radiograph. EXAM: CT CHEST WITHOUT CONTRAST TECHNIQUE:  Multidetector CT imaging of the chest was performed following the standard protocol without IV contrast. COMPARISON:  Chest radiograph on 05/17/2015 FINDINGS: Mediastinum/Lymph Nodes: No masses or pathologically enlarged lymph nodes identified on this un-enhanced exam. Heart size is normal. Mild right-sided pericardial thickening versus tiny pericardial effusion also demonstrated. Lungs/Pleura: Severe bullous emphysema demonstrated. Large mass seen in the right middle lobe which abuts the right heart border as well as the anterior chest wall. This measures 8.3 x 7.7  cm on image 45/series 2, consistent with primary bronchogenic carcinoma. A tiny right pleural effusion is noted. Upper abdomen: 2.8 x 5.0 cm right adrenal mass is seen, highly suspicious for adrenal metastasis. Left adrenal gland is normal in appearance. Musculoskeletal: No chest wall mass or suspicious bone lesions identified. IMPRESSION: 8.3 cm right middle lobe mass which abuts the right heart border and anterior chest wall, highly suspicious for primary bronchogenic carcinoma. 5 cm right adrenal mass, consistent with metastatic disease. Tiny right pleural effusion, likely malignant. Mild right-sided pericardial thickening versus tiny pericardial effusion. Severe bullous emphysema. Electronically Signed   By: Earle Gell M.D.   On: 05/24/2015 15:25   Mr Jeri Cos IO Contrast  06/11/2015  CLINICAL DATA:  70 year old male with headache and blurred vision since February. Recently diagnosed right middle lobe mass with right adrenal and bone metastases. Staging. Subsequent encounter. EXAM: MRI HEAD WITHOUT AND WITH CONTRAST TECHNIQUE: Multiplanar, multiecho pulse sequences of the brain and surrounding structures were obtained without and with intravenous contrast. CONTRAST:  51m MULTIHANCE GADOBENATE DIMEGLUMINE 529 MG/ML IV SOLN COMPARISON:  PET-CT 06/04/2015 FINDINGS: Small 3-4 mm round enhancing lesion in the left parietal lobe just posterior to the  sensory strip on series 14, image 42. Mild associated vasogenic edema (series 10, image 19) with no associated mass effect. Associated minimal blood products (series 11, image 19) without acute hemorrhage. No other brain metastasis identified. No other abnormal intracranial enhancement. No dural thickening identified. No destructive osseous lesion of the skull. Bone marrow signal at the skullbase is within normal limits. Heterogeneously decreased T1 marrow signal in the visualized upper cervical spine, but no destructive upper cervical osseous lesion identified. Negative visualized cervical spinal cord. No restricted diffusion to suggest acute infarction. No midline shift, ventriculomegaly, extra-axial collection or acute intracranial hemorrhage. Cervicomedullary junction and pituitary are within normal limits. Major intracranial vascular flow voids are within normal limits. Scattered mild for age nonspecific cerebral white matter T2 and FLAIR hyperintensity, most commonly due to chronic small vessel disease. No cerebral blood products other than that associated with the small left parietal lobe metastasis. Visible internal auditory structures appear normal. Mastoids are clear. Paranasal sinuses are clear. Negative orbit and scalp soft tissues. IMPRESSION: 1. Positive for early metastatic disease to the brain with a solitary 4 mm left parietal lobe metastasis identified. Mild vasogenic edema without mass effect. 2. No osseous metastatic involvement of the skull identified. No other acute intracranial abnormality identified. Electronically Signed   By: HGenevie AnnM.D.   On: 06/11/2015 10:35   Nm Pet Image Initial (pi) Skull Base To Thigh  06/04/2015  CLINICAL DATA:  Initial treatment strategy for lung mass. EXAM: NUCLEAR MEDICINE PET SKULL BASE TO THIGH TECHNIQUE: 12.6 mCi F-18 FDG was injected intravenously. Full-ring PET imaging was performed from the skull base to thigh after the radiotracer. CT data was obtained  and used for attenuation correction and anatomic localization. FASTING BLOOD GLUCOSE:  Value: 121 mg/dl COMPARISON:  Chest CT of 05/24/2015. FINDINGS: NECK No areas of abnormal hypermetabolism. CHEST Right hilar hypermetabolism is without correlate adenopathy but suspicious. This measures a S.U.V. max of 3.3, including on image 101/series 3. The dominant right middle lobe lung mass is peripherally hypermetabolic and centrally necrotic. This measures 9.3 x 8.9 cm and a S.U.V. max of 21.1. ABDOMEN/PELVIS Hypermetabolism corresponding to the right adrenal mass. 3.0 x 4.5 cm and a S.U.V. max of 10.2 on image 145/series 3. SKELETON Multifocal osseous metastasis. Examples within the a fifth posterior  lateral right rib at a S.U.V. max of 7.4 on image 79/series 3, left side of the L4 vertebral body, at 6.9 x 3.5 cm and a S.U.V. max of 20.1, and right side of the sacrum at 3.1 x 3.0 cm and a S.U.V. max of 12.4. CT IMAGES PERFORMED FOR ATTENUATION CORRECTION No cervical adenopathy. Chest findings deferred to recent diagnostic CT. Right-sided pleural effusion is similar. Advanced centrilobular emphysema. Multivessel coronary artery atherosclerosis. Low-density left renal lesions which are likely cysts. Irregular hepatic capsule, including on image 149/series 3. Bilateral fat containing inguinal hernias. IMPRESSION: 1. 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 2. Soft tissue mass about the left-sided L4 vertebral body. Consider further evaluation with pre and post contrast lumbar spine MRI to evaluate for extension into the canal. 3.  Atherosclerosis, including within the coronary arteries. 4. Small right pleural effusion. 5. Suspicion of cirrhosis. Electronically Signed   By: Abigail Miyamoto M.D.   On: 06/04/2015 11:01   Ct Biopsy  06/16/2015  INDICATION: 70 year old male with new diagnosis of lung mass and multifocal metastatic disease. He presents for  CT-guided biopsy to confirm tissue diagnosis. EXAM: CT BIOPSY MEDICATIONS: None. ANESTHESIA/SEDATION: Moderate (conscious) sedation was employed during this procedure. A total of Versed 3 mg and Fentanyl 75 mcg was administered intravenously. Moderate Sedation Time: 12 minutes. The patient's level of consciousness and vital signs were monitored continuously by radiology nursing throughout the procedure under my direct supervision. FLUOROSCOPY TIME:  None COMPLICATIONS: None immediate. PROCEDURE: Informed written consent was obtained from the patient after a thorough discussion of the procedural risks, benefits and alternatives. All questions were addressed. Maximal Sterile Barrier Technique was utilized including caps, mask, sterile gowns, sterile gloves, sterile drape, hand hygiene and skin antiseptic. A timeout was performed prior to the initiation of the procedure. A planning axial CT scan was performed. The soft tissue mass in the left paraspinal muscles at L4 is successfully identified. A suitable skin entry site was selected and marked. Local anesthesia was attained by infiltration with 1% lidocaine. Under intermittent CT fluoroscopic guidance, a 19 gauge introducer needle was advanced into the margin of the soft tissue mass. Multiple 18 gauge core biopsies were then coaxially obtained using the bio Pince automated biopsy device. Biopsy specimens were placed in formalin and delivered to pathology for further analysis. Post biopsy axial CT imaging demonstrates no evidence of hematoma or other acute complication. IMPRESSION: Technically successful CT-guided core biopsy of left L4 paraspinal soft tissue mass. Signed, Criselda Peaches, MD [facility] Electronically Signed   By: Jacqulynn Cadet M.D.   On: 06/16/2015 14:51    ASSESSMENT & PLAN:   # Right lower lobe lung mass- with multiple lesions noted in the bones/ adrenal metastases/ soft tissue  metastasis of the L4 vertebral body- Biopsy- positive for malignancy. Awaiting IHC. I spoke to the pathologist Dr.Onley.   # The treatment options- include chemotherapy/immunotherapy- after finishing the radiation. They understand all the treatment options palliative only.   # Brain metastases- solitary left parietal. We will inform Dr. Donella Stade.   # Pain control- continue fentanyl 25 g; 5/325 hydrocodone every 4 hours as needed. I recommend adding dexamethasone 4 mg morning/lunch with 4 during radiation to help with the pain. Also recommend adding Xgeva every 4 weeks. We will plan start today. Discussed the potential side effects including but not limited to hypercalcemia and osteonecrosis of the jaw. Continue radiation.  #  Insomnia- likely secondary to pain/see above. Recommend adding Benadryl at nighttime.  # He will also need a port.  # Patient follow-up with me in approximately 10 days with labs; to review the systemic treatment options.  # 25  minutes face-to-face with the patient discussing the above plan of care; more than 50% of time spent on prognosis/ natural history; counseling and coordination.    Cammie Sickle, MD 06/18/2015 12:35 PM

## 2015-06-18 NOTE — Progress Notes (Signed)
Patient and caregivers provided with written and verbal information on xgeva. Approximately 10 minutes of nursing reinforcement teaching on drug was performed.

## 2015-06-21 ENCOUNTER — Ambulatory Visit
Admission: RE | Admit: 2015-06-21 | Discharge: 2015-06-21 | Disposition: A | Discharge status: Home / Self Care | Payer: 59 | Source: Ambulatory Visit | Attending: Radiation Oncology | Admitting: Radiation Oncology

## 2015-06-21 ENCOUNTER — Telehealth: Payer: Self-pay | Admitting: *Deleted

## 2015-06-21 ENCOUNTER — Other Ambulatory Visit: Payer: Self-pay | Admitting: Internal Medicine

## 2015-06-21 DIAGNOSIS — E785 Hyperlipidemia, unspecified: Secondary | ICD-10-CM

## 2015-06-21 DIAGNOSIS — Z720 Tobacco use: Secondary | ICD-10-CM

## 2015-06-21 DIAGNOSIS — C7951 Secondary malignant neoplasm of bone: Secondary | ICD-10-CM | POA: Diagnosis not present

## 2015-06-21 DIAGNOSIS — I10 Essential (primary) hypertension: Secondary | ICD-10-CM

## 2015-06-21 DIAGNOSIS — C3490 Malignant neoplasm of unspecified part of unspecified bronchus or lung: Secondary | ICD-10-CM

## 2015-06-21 MED ORDER — LIDOCAINE-PRILOCAINE 2.5-2.5 % EX CREA: 1.0000 "application " | TOPICAL_CREAM | CUTANEOUS | Status: AC | PRN

## 2015-06-21 NOTE — Telephone Encounter (Signed)
Notified by scheduling that pt is now scheduled with Dr. Candiss Norse at [facility] on 07/28/15 at 10am

## 2015-06-22 ENCOUNTER — Ambulatory Visit
Admission: RE | Admit: 2015-06-22 | Discharge: 2015-06-22 | Disposition: A | Discharge status: Home / Self Care | Payer: 59 | Source: Ambulatory Visit | Attending: Radiation Oncology | Admitting: Radiation Oncology

## 2015-06-22 ENCOUNTER — Other Ambulatory Visit: Payer: Self-pay

## 2015-06-22 ENCOUNTER — Encounter: Payer: Self-pay | Admitting: Radiation Oncology

## 2015-06-22 DIAGNOSIS — C3431 Malignant neoplasm of lower lobe, right bronchus or lung: Secondary | ICD-10-CM

## 2015-06-22 DIAGNOSIS — C7931 Secondary malignant neoplasm of brain: Secondary | ICD-10-CM

## 2015-06-22 DIAGNOSIS — C7951 Secondary malignant neoplasm of bone: Secondary | ICD-10-CM | POA: Diagnosis not present

## 2015-06-22 MED ORDER — FENTANYL 25 MCG/HR TD PT72: 25.0000 ug | MEDICATED_PATCH | TRANSDERMAL | Status: DC

## 2015-06-22 NOTE — Progress Notes (Signed)
Radiation Oncology Follow up Note  Name: Mitchell Nguyen   Date:   06/22/2015 MRN:  [id number] DOB: 11/06/1945    This 70 y.o. male presents to the clinic today for solitary brain metastasis.  REFERRING PROVIDER: No ref. provider found  HPI: Patient is a 70 year old male currently under treatment for stage IV lung cancer with significant disease in his lumbar spine and sacrum receiving palliative treatment to that area.Marland Kitchen He recently had an MRI scan showing an 4 mm left parietal lobe metastasis with mild vasogenic edema without mass effect. He's having no neurologic symptoms specifically denies change in visual fields headaches or any focal neurologic deficits. I been asked to evaluate the patient for possible palliative radiation therapy to this area. He is starting to have some palliative benefit from his spine and sacral radiation fields.  COMPLICATIONS OF TREATMENT: none  FOLLOW UP COMPLIANCE: keeps appointments   PHYSICAL EXAM:  There were no vitals taken for this visit. Well-developed well-nourished patient in NAD. HEENT reveals PERLA, EOMI, discs not visualized.  Oral cavity is clear. No oral mucosal lesions are identified. Neck is clear without evidence of cervical or supraclavicular adenopathy. Lungs are clear to A&P. Cardiac examination is essentially unremarkable with regular rate and rhythm without murmur rub or thrill. Abdomen is benign with no organomegaly or masses noted. Motor sensory and DTR levels are equal and symmetric in the upper and lower extremities. Cranial nerves II through XII are grossly intact. Proprioception is intact. No peripheral adenopathy or edema is identified. No motor or sensory levels are noted. Crude visual fields are within normal range.  RADIOLOGY RESULTS: MRI scan reviewed compatible with the above-stated findings  PLAN: At this time like to go ahead with palliative radiation therapy using I am RT treatment planning and delivery and treat in a  hypofractionated course of treatment to 2400 cGy in 4 fractions. I will best be able to achieve dose homogeneity with I MRT in spare critical structures such as eyes optic nerve brainstem and confined my radiation to precisely the area of metastatic involvement. Risks and benefits of treatment including fatigue possible hair loss possible skin reaction all were described in detail to the patient. I have set up and ordered CT simulation for tomorrow.  I would like to take this opportunity for allowing me to participate in the care of your patient.Armstead Peaks., MD

## 2015-06-22 NOTE — Telephone Encounter (Signed)
Pt notified by Duwayne Heck, CMA of [facility] apt in May. Pt also instructed that his port placement order/request was sent to vascular office. We are still waiting to hear when this will be scheduled. Teach back process performed with patient/caregiver.

## 2015-06-23 ENCOUNTER — Other Ambulatory Visit: Payer: Self-pay | Admitting: Vascular Surgery

## 2015-06-23 ENCOUNTER — Ambulatory Visit
Admission: RE | Admit: 2015-06-23 | Discharge: 2015-06-23 | Disposition: A | Discharge status: Home / Self Care | Payer: 59 | Source: Ambulatory Visit | Attending: Radiation Oncology | Admitting: Radiation Oncology

## 2015-06-23 ENCOUNTER — Other Ambulatory Visit: Payer: Self-pay | Admitting: Internal Medicine

## 2015-06-23 ENCOUNTER — Telehealth: Payer: Self-pay | Admitting: *Deleted

## 2015-06-23 DIAGNOSIS — C7951 Secondary malignant neoplasm of bone: Secondary | ICD-10-CM | POA: Diagnosis not present

## 2015-06-23 DIAGNOSIS — C3431 Malignant neoplasm of lower lobe, right bronchus or lung: Secondary | ICD-10-CM

## 2015-06-23 MED ORDER — HYDROCODONE-ACETAMINOPHEN 5-325 MG PO TABS: ORAL_TABLET | ORAL | Status: DC

## 2015-06-23 NOTE — Telephone Encounter (Signed)
Caregiver came to cancer center today with patient.  Inquired about update for port placement. I called [facility] to see when Dr. Lucky Cowboy or Ronalee Belts could place port a cath. Vascular RN states that "referral was never received." I explained that our office has fax confirmation for this referral; however, I will refax now. Fax confirmation received. Per vascular office, they have an availability open for port placement possibly on Monday. I told the nurse that this date would be fine. She will review the referral and call patient with apt.  Pt's caregiver also picked up/signed for patient for fentanyl patch and hydrocodone RF.  Pt's caregiver states pt has applied for disability and this original paperwork was signed by pulmonology. The patient would like disability extended x 6 months minimal. Will need md note written or patient records faxed to disability office. I explained that a release of information form will need to be completed before the patient's medical records can be released.  Pt in clinic and he agreed to sign a consent form. He asked that RN contact his HR director, Hinton Dyer, at [place] workplace at [phone] to determine if Dr. Rogue Bussing needs to complete any additional paperwork for FMLA/disability extension.

## 2015-06-24 ENCOUNTER — Ambulatory Visit
Admission: RE | Admit: 2015-06-24 | Discharge: 2015-06-24 | Disposition: A | Discharge status: Home / Self Care | Payer: 59 | Source: Ambulatory Visit | Attending: Radiation Oncology | Admitting: Radiation Oncology

## 2015-06-24 DIAGNOSIS — C7951 Secondary malignant neoplasm of bone: Secondary | ICD-10-CM | POA: Diagnosis not present

## 2015-06-25 ENCOUNTER — Ambulatory Visit
Admission: RE | Admit: 2015-06-25 | Discharge: 2015-06-25 | Disposition: A | Discharge status: Home / Self Care | Payer: 59 | Source: Ambulatory Visit | Attending: Radiation Oncology | Admitting: Radiation Oncology

## 2015-06-25 DIAGNOSIS — C7951 Secondary malignant neoplasm of bone: Secondary | ICD-10-CM | POA: Diagnosis not present

## 2015-06-28 ENCOUNTER — Ambulatory Visit
Admission: RE | Admit: 2015-06-28 | Discharge: 2015-06-28 | Disposition: A | Discharge status: Home / Self Care | Payer: 59 | Source: Ambulatory Visit | Attending: Radiation Oncology | Admitting: Radiation Oncology

## 2015-06-28 ENCOUNTER — Encounter
Admission: RE | Disposition: A | Discharge status: Home / Self Care | Payer: Self-pay | Source: Ambulatory Visit | Attending: Vascular Surgery

## 2015-06-28 ENCOUNTER — Ambulatory Visit: Payer: 59

## 2015-06-28 ENCOUNTER — Ambulatory Visit
Admission: RE | Admit: 2015-06-28 | Discharge: 2015-06-28 | Disposition: A | Discharge status: Home / Self Care | Payer: 59 | Source: Ambulatory Visit | Attending: Vascular Surgery | Admitting: Vascular Surgery

## 2015-06-28 DIAGNOSIS — C3490 Malignant neoplasm of unspecified part of unspecified bronchus or lung: Secondary | ICD-10-CM | POA: Insufficient documentation

## 2015-06-28 DIAGNOSIS — G4700 Insomnia, unspecified: Secondary | ICD-10-CM | POA: Diagnosis not present

## 2015-06-28 DIAGNOSIS — I213 ST elevation (STEMI) myocardial infarction of unspecified site: Secondary | ICD-10-CM | POA: Diagnosis not present

## 2015-06-28 DIAGNOSIS — Z955 Presence of coronary angioplasty implant and graft: Secondary | ICD-10-CM | POA: Insufficient documentation

## 2015-06-28 DIAGNOSIS — Z7951 Long term (current) use of inhaled steroids: Secondary | ICD-10-CM | POA: Diagnosis not present

## 2015-06-28 DIAGNOSIS — R918 Other nonspecific abnormal finding of lung field: Secondary | ICD-10-CM | POA: Insufficient documentation

## 2015-06-28 DIAGNOSIS — K5900 Constipation, unspecified: Secondary | ICD-10-CM | POA: Diagnosis not present

## 2015-06-28 DIAGNOSIS — G893 Neoplasm related pain (acute) (chronic): Secondary | ICD-10-CM | POA: Diagnosis not present

## 2015-06-28 DIAGNOSIS — R05 Cough: Secondary | ICD-10-CM | POA: Insufficient documentation

## 2015-06-28 DIAGNOSIS — M1990 Unspecified osteoarthritis, unspecified site: Secondary | ICD-10-CM | POA: Diagnosis not present

## 2015-06-28 DIAGNOSIS — C7931 Secondary malignant neoplasm of brain: Secondary | ICD-10-CM | POA: Diagnosis not present

## 2015-06-28 DIAGNOSIS — Z79899 Other long term (current) drug therapy: Secondary | ICD-10-CM | POA: Insufficient documentation

## 2015-06-28 DIAGNOSIS — Z87891 Personal history of nicotine dependence: Secondary | ICD-10-CM | POA: Insufficient documentation

## 2015-06-28 DIAGNOSIS — Z8489 Family history of other specified conditions: Secondary | ICD-10-CM | POA: Insufficient documentation

## 2015-06-28 DIAGNOSIS — J439 Emphysema, unspecified: Secondary | ICD-10-CM | POA: Insufficient documentation

## 2015-06-28 DIAGNOSIS — C7951 Secondary malignant neoplasm of bone: Secondary | ICD-10-CM | POA: Diagnosis not present

## 2015-06-28 DIAGNOSIS — Z8249 Family history of ischemic heart disease and other diseases of the circulatory system: Secondary | ICD-10-CM | POA: Diagnosis not present

## 2015-06-28 DIAGNOSIS — E785 Hyperlipidemia, unspecified: Secondary | ICD-10-CM | POA: Diagnosis not present

## 2015-06-28 DIAGNOSIS — J449 Chronic obstructive pulmonary disease, unspecified: Secondary | ICD-10-CM | POA: Insufficient documentation

## 2015-06-28 DIAGNOSIS — M545 Low back pain: Secondary | ICD-10-CM | POA: Diagnosis not present

## 2015-06-28 DIAGNOSIS — C7971 Secondary malignant neoplasm of right adrenal gland: Secondary | ICD-10-CM | POA: Insufficient documentation

## 2015-06-28 DIAGNOSIS — I252 Old myocardial infarction: Secondary | ICD-10-CM | POA: Diagnosis not present

## 2015-06-28 DIAGNOSIS — I10 Essential (primary) hypertension: Secondary | ICD-10-CM | POA: Diagnosis not present

## 2015-06-28 HISTORY — PX: PERIPHERAL VASCULAR CATHETERIZATION: SHX172C

## 2015-06-28 SURGERY — PORTA CATH INSERTION
Anesthesia: Moderate Sedation

## 2015-06-28 MED ORDER — ONDANSETRON HCL 4 MG/2ML IJ SOLN: 4.0000 mg | Freq: Four times a day (QID) | INTRAMUSCULAR | Status: DC | PRN

## 2015-06-28 MED ORDER — LIDOCAINE-EPINEPHRINE (PF) 1 %-1:200000 IJ SOLN
INTRAMUSCULAR | Status: AC
  Filled 2015-06-28: qty 30

## 2015-06-28 MED ORDER — SODIUM CHLORIDE 0.9 % IR SOLN
Freq: Once | Status: DC
  Filled 2015-06-28: qty 2

## 2015-06-28 MED ORDER — MIDAZOLAM HCL 2 MG/2ML IJ SOLN
INTRAMUSCULAR | Status: DC | PRN
  Administered 2015-06-28: 2 mg via INTRAVENOUS

## 2015-06-28 MED ORDER — DEXTROSE 5 % IV SOLN
1.5000 g | INTRAVENOUS | Status: AC
  Administered 2015-06-28: 1.5 g via INTRAVENOUS

## 2015-06-28 MED ORDER — SODIUM CHLORIDE 0.9 % IV SOLN
INTRAVENOUS | Status: DC
  Administered 2015-06-28: 09:00:00 via INTRAVENOUS

## 2015-06-28 MED ORDER — HEPARIN (PORCINE) IN NACL 2-0.9 UNIT/ML-% IJ SOLN
INTRAMUSCULAR | Status: AC
  Filled 2015-06-28: qty 500

## 2015-06-28 MED ORDER — FENTANYL CITRATE (PF) 100 MCG/2ML IJ SOLN
INTRAMUSCULAR | Status: DC | PRN
  Administered 2015-06-28 (×2): 50 ug via INTRAVENOUS

## 2015-06-28 MED ORDER — LIDOCAINE-EPINEPHRINE (PF) 1 %-1:200000 IJ SOLN
INTRAMUSCULAR | Status: DC | PRN
  Administered 2015-06-28: 20 mL via INTRADERMAL

## 2015-06-28 MED ORDER — FENTANYL CITRATE (PF) 100 MCG/2ML IJ SOLN
INTRAMUSCULAR | Status: AC
  Filled 2015-06-28: qty 2

## 2015-06-28 MED ORDER — MIDAZOLAM HCL 5 MG/5ML IJ SOLN
INTRAMUSCULAR | Status: AC
  Filled 2015-06-28: qty 5

## 2015-06-28 MED ORDER — HYDROMORPHONE HCL 1 MG/ML IJ SOLN: 1.0000 mg | Freq: Once | INTRAMUSCULAR | Status: DC

## 2015-06-28 SURGICAL SUPPLY — 10 items
BAG DECANTER STRL (MISCELLANEOUS) ×3 IMPLANT
KIT PORT POWER 8FR ISP CVUE (Catheter) ×3 IMPLANT
PACK ANGIOGRAPHY (CUSTOM PROCEDURE TRAY) ×3 IMPLANT
PAD GROUND ADULT SPLIT (MISCELLANEOUS) ×3 IMPLANT
PENCIL ELECTRO HAND CTR (MISCELLANEOUS) ×3 IMPLANT
PREP CHG 10.5 TEAL (MISCELLANEOUS) ×3 IMPLANT
SUT MNCRL AB 4-0 PS2 18 (SUTURE) ×3 IMPLANT
SUT PROLENE 0 CT 1 30 (SUTURE) ×3 IMPLANT
SUTURE VIC 3-0 (SUTURE) ×3 IMPLANT
TOWEL OR 17X26 4PK STRL BLUE (TOWEL DISPOSABLE) ×3 IMPLANT

## 2015-06-28 NOTE — Discharge Instructions (Signed)

## 2015-06-28 NOTE — H&P (Signed)
  [facility] History & Physical Update  The patient was interviewed and re-examined.  The patient's previous History and Physical has been reviewed and is unchanged.  There is no change in the plan of care. We plan to proceed with the scheduled procedure.  Maeghan Canny, MD  06/28/2015, 8:52 AM

## 2015-06-28 NOTE — Op Note (Signed)
      [facility]       Operative Note  Date: 06/28/2015  Preoperative diagnosis:  1. Lung cancer  Postoperative diagnosis:  Same as above  Procedures: #1. Ultrasound guidance for vascular access to the right internal jugular vein. #2. Fluoroscopic guidance for placement of catheter. #3. Placement of CT compatible Port-A-Cath, right internal jugular vein.  Surgeon: Leotis Pain, MD.   Anesthesia: Local with moderate conscious sedation for approximately 25  minutes using 2 mg of Versed and 100 mcg of Fentanyl  Fluoroscopy time: less than 1 minute  Contrast used: 0  Estimated blood loss: Minimal  Indication for the procedure:  The patient is a 70 y.o.male with metastatic lung cancer.  The patient needs a Port-A-Cath for durable venous access, chemotherapy, lab draws, and CT scans. We are asked to place this. Risks and benefits were discussed and informed consent was obtained.  Description of procedure: The patient was brought to the vascular and interventional radiology suite.  Moderate conscious sedation was administered throughout the procedure with my supervision of the RN administering medicines and monitoring the patient's vital signs, pulse oximetry, telemetry and mental status throughout from the start of the procedure until the patient was taken to the recovery room. The right neck chest and shoulder were sterilely prepped and draped, and a sterile surgical field was created. Ultrasound was used to help visualize a patent right internal jugular vein. This was then accessed under direct ultrasound guidance without difficulty with the Seldinger needle and a permanent image was recorded. A J-wire was placed. After skin nick and dilatation, the peel-away sheath was then placed over the wire. I then anesthetized an area under the clavicle approximately 1-2 fingerbreadths. A transverse incision was created and an inferior pocket was created with electrocautery and blunt  dissection. The port was then brought onto the field, placed into the pocket and secured to the chest wall with 2 Prolene sutures. The catheter was connected to the port and tunneled from the subclavicular incision to the access site. Fluoroscopic guidance was then used to cut the catheter to an appropriate length. The catheter was then placed through the peel-away sheath and the peel-away sheath was removed. The catheter tip was parked in excellent location under fluorocoscopic guidance in the mid superior vena cava. The pocket was then irrigated with antibiotic impregnated saline and the wound was closed with a running 3-0 Vicryl and a 4-0 Monocryl. The access incision was closed with a single 4-0 Monocryl. The Huber needle was used to withdraw blood and flush the port with heparinized saline. Dermabond was then placed as a dressing. The patient tolerated the procedure well and was taken to the recovery room in stable condition.   Dequon Schnebly 06/28/2015 11:11 AM

## 2015-06-29 ENCOUNTER — Encounter: Payer: Self-pay | Admitting: Vascular Surgery

## 2015-06-29 ENCOUNTER — Ambulatory Visit
Admission: RE | Admit: 2015-06-29 | Discharge: 2015-06-29 | Disposition: A | Discharge status: Home / Self Care | Payer: 59 | Source: Ambulatory Visit | Attending: Radiation Oncology | Admitting: Radiation Oncology

## 2015-06-29 DIAGNOSIS — C7951 Secondary malignant neoplasm of bone: Secondary | ICD-10-CM | POA: Diagnosis not present

## 2015-06-30 ENCOUNTER — Ambulatory Visit
Admission: RE | Admit: 2015-06-30 | Discharge: 2015-06-30 | Disposition: A | Discharge status: Home / Self Care | Payer: 59 | Source: Ambulatory Visit | Attending: Radiation Oncology | Admitting: Radiation Oncology

## 2015-06-30 ENCOUNTER — Ambulatory Visit: Payer: 59

## 2015-06-30 DIAGNOSIS — C7951 Secondary malignant neoplasm of bone: Secondary | ICD-10-CM | POA: Diagnosis not present

## 2015-07-01 ENCOUNTER — Ambulatory Visit: Payer: 59

## 2015-07-01 ENCOUNTER — Ambulatory Visit
Admission: RE | Admit: 2015-07-01 | Discharge: 2015-07-01 | Disposition: A | Discharge status: Home / Self Care | Payer: 59 | Source: Ambulatory Visit | Attending: Radiation Oncology | Admitting: Radiation Oncology

## 2015-07-01 DIAGNOSIS — C7951 Secondary malignant neoplasm of bone: Secondary | ICD-10-CM | POA: Diagnosis not present

## 2015-07-02 ENCOUNTER — Telehealth: Payer: Self-pay | Admitting: *Deleted

## 2015-07-02 ENCOUNTER — Inpatient Hospital Stay: Payer: 59 | Attending: Internal Medicine

## 2015-07-02 ENCOUNTER — Inpatient Hospital Stay (HOSPITAL_BASED_OUTPATIENT_CLINIC_OR_DEPARTMENT_OTHER): Payer: 59 | Admitting: Internal Medicine

## 2015-07-02 VITALS — BP 120/78 | HR 111 | Temp 98.1°F | Resp 24 | Ht 72.0 in | Wt 151.5 lb

## 2015-07-02 DIAGNOSIS — I10 Essential (primary) hypertension: Secondary | ICD-10-CM

## 2015-07-02 DIAGNOSIS — Z87891 Personal history of nicotine dependence: Secondary | ICD-10-CM | POA: Insufficient documentation

## 2015-07-02 DIAGNOSIS — M545 Low back pain: Secondary | ICD-10-CM | POA: Diagnosis not present

## 2015-07-02 DIAGNOSIS — E860 Dehydration: Secondary | ICD-10-CM | POA: Insufficient documentation

## 2015-07-02 DIAGNOSIS — Z923 Personal history of irradiation: Secondary | ICD-10-CM | POA: Diagnosis not present

## 2015-07-02 DIAGNOSIS — K5900 Constipation, unspecified: Secondary | ICD-10-CM | POA: Insufficient documentation

## 2015-07-02 DIAGNOSIS — J449 Chronic obstructive pulmonary disease, unspecified: Secondary | ICD-10-CM

## 2015-07-02 DIAGNOSIS — C7971 Secondary malignant neoplasm of right adrenal gland: Secondary | ICD-10-CM | POA: Diagnosis not present

## 2015-07-02 DIAGNOSIS — C3491 Malignant neoplasm of unspecified part of right bronchus or lung: Secondary | ICD-10-CM | POA: Insufficient documentation

## 2015-07-02 DIAGNOSIS — E785 Hyperlipidemia, unspecified: Secondary | ICD-10-CM | POA: Diagnosis not present

## 2015-07-02 DIAGNOSIS — Z5111 Encounter for antineoplastic chemotherapy: Secondary | ICD-10-CM | POA: Diagnosis not present

## 2015-07-02 DIAGNOSIS — R5383 Other fatigue: Secondary | ICD-10-CM | POA: Diagnosis not present

## 2015-07-02 DIAGNOSIS — C3431 Malignant neoplasm of lower lobe, right bronchus or lung: Secondary | ICD-10-CM | POA: Insufficient documentation

## 2015-07-02 DIAGNOSIS — C7931 Secondary malignant neoplasm of brain: Secondary | ICD-10-CM

## 2015-07-02 DIAGNOSIS — Z7982 Long term (current) use of aspirin: Secondary | ICD-10-CM

## 2015-07-02 DIAGNOSIS — D72829 Elevated white blood cell count, unspecified: Secondary | ICD-10-CM | POA: Insufficient documentation

## 2015-07-02 DIAGNOSIS — Z79899 Other long term (current) drug therapy: Secondary | ICD-10-CM | POA: Diagnosis not present

## 2015-07-02 DIAGNOSIS — J439 Emphysema, unspecified: Secondary | ICD-10-CM

## 2015-07-02 DIAGNOSIS — I2510 Atherosclerotic heart disease of native coronary artery without angina pectoris: Secondary | ICD-10-CM | POA: Diagnosis not present

## 2015-07-02 DIAGNOSIS — I252 Old myocardial infarction: Secondary | ICD-10-CM

## 2015-07-02 DIAGNOSIS — R630 Anorexia: Secondary | ICD-10-CM

## 2015-07-02 DIAGNOSIS — C342 Malignant neoplasm of middle lobe, bronchus or lung: Secondary | ICD-10-CM

## 2015-07-02 DIAGNOSIS — C7951 Secondary malignant neoplasm of bone: Secondary | ICD-10-CM | POA: Insufficient documentation

## 2015-07-02 DIAGNOSIS — R000 Tachycardia, unspecified: Secondary | ICD-10-CM | POA: Diagnosis not present

## 2015-07-02 DIAGNOSIS — R509 Fever, unspecified: Secondary | ICD-10-CM | POA: Diagnosis not present

## 2015-07-02 LAB — CBC WITH DIFFERENTIAL/PLATELET
BASOS ABS: 0.1 10*3/uL (ref 0–0.1)
Basophils Relative: 0 %
Eosinophils Absolute: 1.5 10*3/uL — ABNORMAL HIGH (ref 0–0.7)
Eosinophils Relative: 3 %
HEMATOCRIT: 38.5 % — AB (ref 40.0–52.0)
Hemoglobin: 12.7 g/dL — ABNORMAL LOW (ref 13.0–18.0)
LYMPHS ABS: 0.6 10*3/uL — AB (ref 1.0–3.6)
MCH: 30 pg (ref 26.0–34.0)
MCHC: 33.1 g/dL (ref 32.0–36.0)
MCV: 90.6 fL (ref 80.0–100.0)
MONO ABS: 1.8 10*3/uL — AB (ref 0.2–1.0)
NEUTROS ABS: 55.2 10*3/uL — AB (ref 1.4–6.5)
Neutrophils Relative %: 93 %
PLATELETS: 356 10*3/uL (ref 150–440)
RBC: 4.25 MIL/uL — ABNORMAL LOW (ref 4.40–5.90)
RDW: 16.7 % — AB (ref 11.5–14.5)
WBC: 59.1 10*3/uL (ref 3.8–10.6)

## 2015-07-02 LAB — BASIC METABOLIC PANEL
Anion gap: 8 (ref 5–15)
BUN: 33 mg/dL — AB (ref 6–20)
CALCIUM: 7.6 mg/dL — AB (ref 8.9–10.3)
CO2: 20 mmol/L — AB (ref 22–32)
CREATININE: 0.99 mg/dL (ref 0.61–1.24)
Chloride: 100 mmol/L — ABNORMAL LOW (ref 101–111)
GFR calc Af Amer: >60 mL/min (ref >60)
GFR calc non Af Amer: >60 mL/min (ref >60)
GLUCOSE: 141 mg/dL — AB (ref 65–99)
Potassium: 5.2 mmol/L — ABNORMAL HIGH (ref 3.5–5.1)
Sodium: 128 mmol/L — ABNORMAL LOW (ref 135–145)

## 2015-07-02 MED ORDER — FENTANYL 50 MCG/HR TD PT72: 50.0000 ug | MEDICATED_PATCH | TRANSDERMAL | Status: AC

## 2015-07-02 MED ORDER — ONDANSETRON HCL 8 MG PO TABS: 8.0000 mg | ORAL_TABLET | Freq: Three times a day (TID) | ORAL | Status: AC | PRN

## 2015-07-02 MED ORDER — PROCHLORPERAZINE MALEATE 10 MG PO TABS: 10.0000 mg | ORAL_TABLET | Freq: Four times a day (QID) | ORAL | Status: AC | PRN

## 2015-07-02 MED ORDER — HYDROCODONE-ACETAMINOPHEN 5-325 MG PO TABS: ORAL_TABLET | ORAL | Status: DC

## 2015-07-02 NOTE — Patient Instructions (Signed)
You will be set up for a chemotherapy teaching class next week.  You will start chemotherapy next week on Friday.  Below is some information on your chemotherapy treatments.    Carboplatin injection What is this medicine? CARBOPLATIN (KAR boe pla tin) is a chemotherapy drug. It targets fast dividing cells, like cancer cells, and causes these cells to die. This medicine is used to treat ovarian cancer and many other cancers. This medicine may be used for other purposes; ask your health care provider or pharmacist if you have questions. What should I tell my health care provider before I take this medicine? They need to know if you have any of these conditions: -blood disorders -hearing problems -kidney disease -recent or ongoing radiation therapy -an unusual or allergic reaction to carboplatin, cisplatin, other chemotherapy, other medicines, foods, dyes, or preservatives -pregnant or trying to get pregnant -breast-feeding How should I use this medicine? This drug is usually given as an infusion into a vein. It is administered in a hospital or clinic by a specially trained health care professional. Talk to your pediatrician regarding the use of this medicine in children. Special care may be needed. Overdosage: If you think you have taken too much of this medicine contact a poison control center or emergency room at once. NOTE: This medicine is only for you. Do not share this medicine with others. What if I miss a dose? It is important not to miss a dose. Call your doctor or health care professional if you are unable to keep an appointment. What may interact with this medicine? -medicines for seizures -medicines to increase blood counts like filgrastim, pegfilgrastim, sargramostim -some antibiotics like amikacin, gentamicin, neomycin, streptomycin, tobramycin -vaccines Talk to your doctor or health care professional before taking any of these  medicines: -acetaminophen -aspirin -ibuprofen -ketoprofen -naproxen This list may not describe all possible interactions. Give your health care provider a list of all the medicines, herbs, non-prescription drugs, or dietary supplements you use. Also tell them if you smoke, drink alcohol, or use illegal drugs. Some items may interact with your medicine. What should I watch for while using this medicine? Your condition will be monitored carefully while you are receiving this medicine. You will need important blood work done while you are taking this medicine. This drug may make you feel generally unwell. This is not uncommon, as chemotherapy can affect healthy cells as well as cancer cells. Report any side effects. Continue your course of treatment even though you feel ill unless your doctor tells you to stop. In some cases, you may be given additional medicines to help with side effects. Follow all directions for their use. Call your doctor or health care professional for advice if you get a fever, chills or sore throat, or other symptoms of a cold or flu. Do not treat yourself. This drug decreases your body's ability to fight infections. Try to avoid being around people who are sick. This medicine may increase your risk to bruise or bleed. Call your doctor or health care professional if you notice any unusual bleeding. Be careful brushing and flossing your teeth or using a toothpick because you may get an infection or bleed more easily. If you have any dental work done, tell your dentist you are receiving this medicine. Avoid taking products that contain aspirin, acetaminophen, ibuprofen, naproxen, or ketoprofen unless instructed by your doctor. These medicines may hide a fever. Do not become pregnant while taking this medicine. Women should inform their doctor if they  wish to become pregnant or think they might be pregnant. There is a potential for serious side effects to an unborn child. Talk to  your health care professional or pharmacist for more information. Do not breast-feed an infant while taking this medicine. What side effects may I notice from receiving this medicine? Side effects that you should report to your doctor or health care professional as soon as possible: -allergic reactions like skin rash, itching or hives, swelling of the face, lips, or tongue -signs of infection - fever or chills, cough, sore throat, pain or difficulty passing urine -signs of decreased platelets or bleeding - bruising, pinpoint red spots on the skin, black, tarry stools, nosebleeds -signs of decreased red blood cells - unusually weak or tired, fainting spells, lightheadedness -breathing problems -changes in hearing -changes in vision -chest pain -high blood pressure -low blood counts - This drug may decrease the number of white blood cells, red blood cells and platelets. You may be at increased risk for infections and bleeding. -nausea and vomiting -pain, swelling, redness or irritation at the injection site -pain, tingling, numbness in the hands or feet -problems with balance, talking, walking -trouble passing urine or change in the amount of urine Side effects that usually do not require medical attention (report to your doctor or health care professional if they continue or are bothersome): -hair loss -loss of appetite -metallic taste in the mouth or changes in taste This list may not describe all possible side effects. Call your doctor for medical advice about side effects. You may report side effects to FDA at 1-800-FDA-1088. Where should I keep my medicine? This drug is given in a hospital or clinic and will not be stored at home. NOTE: This sheet is a summary. It may not cover all possible information. If you have questions about this medicine, talk to your doctor, pharmacist, or health care provider.    2016, Elsevier/[employer] Standard. (2007-06-18 14:38:05)     Paclitaxel  injection What is this medicine? PACLITAXEL (PAK li TAX el) is a chemotherapy drug. It targets fast dividing cells, like cancer cells, and causes these cells to die. This medicine is used to treat ovarian cancer, breast cancer, and other cancers. This medicine may be used for other purposes; ask your health care provider or pharmacist if you have questions. What should I tell my health care provider before I take this medicine? They need to know if you have any of these conditions: -blood disorders -irregular heartbeat -infection (especially a virus infection such as chickenpox, cold sores, or herpes) -liver disease -previous or ongoing radiation therapy -an unusual or allergic reaction to paclitaxel, alcohol, polyoxyethylated castor oil, other chemotherapy agents, other medicines, foods, dyes, or preservatives -pregnant or trying to get pregnant -breast-feeding How should I use this medicine? This drug is given as an infusion into a vein. It is administered in a hospital or clinic by a specially trained health care professional. Talk to your pediatrician regarding the use of this medicine in children. Special care may be needed. Overdosage: If you think you have taken too much of this medicine contact a poison control center or emergency room at once. NOTE: This medicine is only for you. Do not share this medicine with others. What if I miss a dose? It is important not to miss your dose. Call your doctor or health care professional if you are unable to keep an appointment. What may interact with this medicine? Do not take this medicine with any of the  following medications: -disulfiram -metronidazole This medicine may also interact with the following medications: -cyclosporine -diazepam -ketoconazole -medicines to increase blood counts like filgrastim, pegfilgrastim, sargramostim -other chemotherapy drugs like cisplatin, doxorubicin, epirubicin, etoposide, teniposide,  vincristine -quinidine -testosterone -vaccines -verapamil Talk to your doctor or health care professional before taking any of these medicines: -acetaminophen -aspirin -ibuprofen -ketoprofen -naproxen This list may not describe all possible interactions. Give your health care provider a list of all the medicines, herbs, non-prescription drugs, or dietary supplements you use. Also tell them if you smoke, drink alcohol, or use illegal drugs. Some items may interact with your medicine. What should I watch for while using this medicine? Your condition will be monitored carefully while you are receiving this medicine. You will need important blood work done while you are taking this medicine. This drug may make you feel generally unwell. This is not uncommon, as chemotherapy can affect healthy cells as well as cancer cells. Report any side effects. Continue your course of treatment even though you feel ill unless your doctor tells you to stop. This medicine can cause serious allergic reactions. To reduce your risk you will need to take other medicine(s) before treatment with this medicine. In some cases, you may be given additional medicines to help with side effects. Follow all directions for their use. Call your doctor or health care professional for advice if you get a fever, chills or sore throat, or other symptoms of a cold or flu. Do not treat yourself. This drug decreases your body's ability to fight infections. Try to avoid being around people who are sick. This medicine may increase your risk to bruise or bleed. Call your doctor or health care professional if you notice any unusual bleeding. Be careful brushing and flossing your teeth or using a toothpick because you may get an infection or bleed more easily. If you have any dental work done, tell your dentist you are receiving this medicine. Avoid taking products that contain aspirin, acetaminophen, ibuprofen, naproxen, or ketoprofen unless  instructed by your doctor. These medicines may hide a fever. Do not become pregnant while taking this medicine. Women should inform their doctor if they wish to become pregnant or think they might be pregnant. There is a potential for serious side effects to an unborn child. Talk to your health care professional or pharmacist for more information. Do not breast-feed an infant while taking this medicine. Men are advised not to father a child while receiving this medicine. This product may contain alcohol. Ask your pharmacist or healthcare provider if this medicine contains alcohol. Be sure to tell all healthcare providers you are taking this medicine. Certain medicines, like metronidazole and disulfiram, can cause an unpleasant reaction when taken with alcohol. The reaction includes flushing, headache, nausea, vomiting, sweating, and increased thirst. The reaction can last from 30 minutes to several hours. What side effects may I notice from receiving this medicine? Side effects that you should report to your doctor or health care professional as soon as possible: -allergic reactions like skin rash, itching or hives, swelling of the face, lips, or tongue -low blood counts - This drug may decrease the number of white blood cells, red blood cells and platelets. You may be at increased risk for infections and bleeding. -signs of infection - fever or chills, cough, sore throat, pain or difficulty passing urine -signs of decreased platelets or bleeding - bruising, pinpoint red spots on the skin, black, tarry stools, nosebleeds -signs of decreased red blood cells -  unusually weak or tired, fainting spells, lightheadedness -breathing problems -chest pain -high or low blood pressure -mouth sores -nausea and vomiting -pain, swelling, redness or irritation at the injection site -pain, tingling, numbness in the hands or feet -slow or irregular heartbeat -swelling of the ankle, feet, hands Side effects that  usually do not require medical attention (report to your doctor or health care professional if they continue or are bothersome): -bone pain -complete hair loss including hair on your head, underarms, pubic hair, eyebrows, and eyelashes -changes in the color of fingernails -diarrhea -loosening of the fingernails -loss of appetite -muscle or joint pain -red flush to skin -sweating This list may not describe all possible side effects. Call your doctor for medical advice about side effects. You may report side effects to FDA at 1-800-FDA-1088. Where should I keep my medicine? This drug is given in a hospital or clinic and will not be stored at home. NOTE: This sheet is a summary. It may not cover all possible information. If you have questions about this medicine, talk to your doctor, pharmacist, or health care provider.    2016, [employer]/Gold Standard. (2014-10-29 13:02:56)      Chemotherapy Chemotherapy is the use of medicines to stop or slow the growth of cancer cells. Depending on the type and stage of your cancer, you may have chemotherapy to: 1. Cure your cancer. 2. Slow the progression of your cancer. 3. Ease your cancer symptoms. 4. Improve the benefits of radiation treatment. 5. Shrink a tumor before surgery. 6. Rid the body of cancer cells that remain after a tumor is surgically removed. HOW IS CHEMOTHERAPY GIVEN? Chemotherapy may be given: 1. By mouth in liquid or pill form. 2. Through a thin tube that is inserted into a vein or artery. 3. By getting a shot. 4. By rubbing a cream or ointment on your skin. 5. Through liquids that are placed directly into various areas of the body, such as the abdomen, chest, or bladder. HOW OFTEN IS CHEMOTHERAPY GIVEN? Chemotherapy may be given continuously over time, or it may be given in cycles. For example, you may take the medicine for one week out of every month. FOR HOW LONG WILL I NEED CHEMOTHERAPY TREATMENTS? The length of  treatment depends on many factors, including:  The type of cancer.  Whether the cancer has spread.  How you respond to the chemotherapy.  Whether you develop side effects. Some types of chemotherapy medicine are given only one time. Others are given for months, years, or for life. WHAT SAFETY PRECAUTIONS MUST I TAKE WHILE ON CHEMOTHERAPY? Chemotherapy medicines are very strong. They will be in all of your bodily fluids, including your urine, stool, saliva, sweat, tears, vaginal secretions, and semen. You must carefully follow some safety precautions to prevent harm to others while you are using these medicines. Here are some recommended precautions:  Make sure that people who help care for you wear disposable gloves if they are going to come into contact with any of your bodily fluids. Women who are pregnant or breastfeeding should not handle any of your bodily fluids.  Wash any clothes, towels, and linens that may have your bodily fluids on them twice in a washing machine using very hot water.  Dispose of adult diapers, tampons, and sanitary napkins by first sealing them in a plastic bag.  Use a condom when having sex for at least 2 weeks after receiving your chemotherapy.  Do not share beverages or food.  Keep your chemotherapy  medicines in their original bottles. Keep them in a high, safe location, away from children. Do not expose them to heat or moisture. Do not put them in containers with other types of medicines.  Dispose of all wrappers for your chemotherapy medicines by sealing them in a separate plastic bag.  Do not throw away extra medicine, and do not flush it down the toilet. Take medicine that you are not going to use to your health care provider's office where it can be disposed of properly.  Follow your health care provider's directions for the proper disposal of needles, IV tubing, and other medical supplies that have come into contact with your chemotherapy  medicines.  If you are issued a hazardous waste container, make sure you understand the directions for using it.  Wash your hands thoroughly with warm water and soap after using the bathroom. Dry your hands with disposable paper towels.  When using the toilet:  Flush it twice after each use, including after vomiting.  Close the lid of the toilet prior to flushing. This helps to avoid splashing.  Both men and women should sit to use the toilet. This helps avoid splashing. WHAT ARE THE SIDE EFFECTS OF CHEMOTHERAPY? Side effects depend on a variety of factors, including:  The specific type of chemotherapy medicine used.  The dosage.  How long the medicine is used for.  Your overall health. Some of the side effects you may experience include:  Fatigue and decreased energy.  Decreased appetite.  Changes in your sense of smell or taste.  Nausea.  Vomiting.  Constipation or diarrhea.  Hair loss.  Increased susceptibility to infection.  Easy bleeding.  Mouth sores.  Burning or tingling in the hands or feet.  Memory problems.   This information is not intended to replace advice given to you by your health care provider. Make sure you discuss any questions you have with your health care provider.   Document Released: 01/08/2007 Document Revised: 04/03/2014 Document Reviewed: 08/19/2013 Elsevier Interactive Patient Education 2016 [place] Discharge Instructions for Patients Receiving Chemotherapy  To help prevent nausea and vomiting after your treatment, we encourage you to take your nausea medication.   If you develop nausea and vomiting that is not controlled by your nausea medication, call the clinic.   BELOW ARE SYMPTOMS THAT SHOULD BE REPORTED IMMEDIATELY:  *FEVER GREATER THAN 100.5 F  *CHILLS WITH OR WITHOUT FEVER  NAUSEA AND VOMITING THAT IS NOT CONTROLLED WITH YOUR NAUSEA MEDICATION  *UNUSUAL SHORTNESS OF  BREATH  *UNUSUAL BRUISING OR BLEEDING  TENDERNESS IN MOUTH AND THROAT WITH OR WITHOUT PRESENCE OF ULCERS  *URINARY PROBLEMS  *BOWEL PROBLEMS  UNUSUAL RASH Items with indicate a potential emergency and should be followed up as soon as possible.  Feel free to call the clinic you have any questions or concerns. The clinic phone number is [phone].  Please show the [facility] at check-in to the Emergency Department and triage nurse.

## 2015-07-02 NOTE — Progress Notes (Signed)
[place] CONSULT NOTE  Patient Care Team: No Pcp Per Patient as PCP - General (General Practice)  CHIEF COMPLAINTS/PURPOSE OF CONSULTATION:   # MARCH 2017-RIGHT LUNG CA STAGE IV-[ sacral soft tissue Bx- pleomorphic-poorly diff; PDL-1 pending; mol testing- not possible]- multiple bone metastases; Right adrenal Met; START CARBO-TAXOL q 3W  # Left parietal Brain met- 4 mm [plan RT]  # Bone metastases/ on RT [finished April 5th]  # Smoker [quit 2016]/COPD/ CAD s/p stent [x2 in 2014]  HISTORY OF PRESENTING ILLNESS:  Mitchell Nguyen 70 y.o.  male with newly diagnosed likely right lung cancer with multiple metastases described above is here for follow-up/to review the next treatment plan.  Patient is currently status post radiation to this sacral/left vertebral body for palliative reasons.  Patient continues to have significant pain in his sacrum and also the left vertebral region. He is currently on fentanyl 25/and also taking hydrocodone 5 every 4 hours.  Poor appetite. No nausea no vomiting. No headaches. No fevers. Overall feels poorly. Has cough with mild intermittent hemoptysis. Has chronic shortness of breath or any worse.  ROS: A complete 10 point review of system is done which is negative except mentioned above in history of present illness  MEDICAL HISTORY:  Past Medical History  Diagnosis Date  . Hypertension   . MI (myocardial infarction) ([place])     x2 (2012 and 2014) at [facility]  . Hyperlipidemia   . Lung mass   . COPD (chronic obstructive pulmonary disease) ([place])   . Cough   . Emphysema of lung ([place])   . Shortness of breath   . Constipation   . Arthritis   . Low back pain   . ST elevation myocardial infarction (STEMI) ([place])   . History of tobacco abuse   . Hx of cardiac cath     SURGICAL HISTORY: Past Surgical History  Procedure Laterality Date  . Cardiac surgery      stents placed x2  . Cardiac catheterization    . Peripheral vascular  catheterization N/A 06/28/2015    Procedure: Glori Luis Cath Insertion;  Surgeon: Algernon Huxley, MD;  Location: [facility];  Service: Cardiovascular;  Laterality: N/A;    SOCIAL HISTORY: Social History   Social History  . Marital Status: Married    Spouse Name: N/A  . Number of Children: N/A  . Years of Education: N/A   Occupational History  . Not on file.   Social History Main Topics  . Smoking status: Former Smoker -- 1.00 packs/day for 50 years    Types: Cigarettes    Quit date: 11/26/2014  . Smokeless tobacco: Never Used     Comment: "stopped 6 months ago" 2016  . Alcohol Use: No  . Drug Use: No  . Sexual Activity: Not on file   Other Topics Concern  . Not on file   Social History Narrative    FAMILY HISTORY: Family History  Problem Relation Age of Onset  . Hypertension    . Other Brother     "blood disorder"  . Heart failure Mother   . Heart disease Brother   . Heart disease Brother   . Heart disease      ALLERGIES:  is allergic to lisinopril.  MEDICATIONS:  Current Outpatient Prescriptions  Medication Sig Dispense Refill  . albuterol (PROAIR HFA) 108 (90 Base) MCG/ACT inhaler Inhale 2 Inhalers into the lungs every 6 (six) hours as needed. wheezing    . dexamethasone (  DECADRON) 4 MG tablet Take one pill with breakfast/lunch 20 tablet 0  . docusate sodium (COLACE) 100 MG capsule Take 100 mg by mouth 2 (two) times daily. Reported on 06/18/2015    . fentaNYL (DURAGESIC - DOSED MCG/HR) 25 MCG/HR patch Place 1 patch (25 mcg total) onto the skin every 3 (three) days. 5 patch 0  . HYDROcodone-acetaminophen (NORCO/VICODIN) 5-325 MG tablet 1 tab po  q  6-8 hours prn pain 90 tablet 0  . lidocaine-prilocaine (EMLA) cream Apply 1 application topically as needed. Apply to port then cover with saran wrap 1-2 hours before chemotherapy appointment 30 g 2  . losartan (COZAAR) 50 MG tablet Take 50 mg by mouth daily.    . magnesium hydroxide (MILK OF MAGNESIA) 400 MG/5ML  suspension Take 5 mLs by mouth daily as needed for mild constipation.    . metoprolol (LOPRESSOR) 50 MG tablet Take 50 mg by mouth daily.     . NON FORMULARY Take 2 capsules by mouth daily. Reported on 06/18/2015    . omega-3 acid ethyl esters (LOVAZA) 1 g capsule Take 1 g by mouth daily. Reported on 06/18/2015    . polyethylene glycol (MIRALAX / GLYCOLAX) packet Take 17 g by mouth daily as needed for mild constipation.    Marland Kitchen tiotropium (SPIRIVA) 18 MCG inhalation capsule Place 1 capsule into inhaler and inhale daily.    Marland Kitchen aspirin 81 MG tablet Take 81 mg by mouth daily. Reported on 07/02/2015     No current facility-administered medications for this visit.      Marland Kitchen  PHYSICAL EXAMINATION: ECOG PERFORMANCE STATUS: 1 - Symptomatic but completely ambulatory  Filed Vitals:   07/02/15 1038  BP: 120/78  Pulse: 111  Temp: 98.1 F (36.7 C)   Filed Weights   07/02/15 1038  Weight: 151 lb 7.3 oz (68.7 kg)    GENERAL: Well-nourished well-developed; Alert, no distress and comfortable.   Accompanied by multiple family members including wife sister and son. He is in a wheelchair because of pain. EYES: no pallor or icterus OROPHARYNX: no thrush or ulceration;  NECK: supple, no masses felt LYMPH:  no palpable lymphadenopathy in the cervical, axillary or inguinal regions LUNGS: Decreased breath sounds at the right lower base  No wheeze or crackles HEART/CVS: regular rate & rhythm and no murmurs; No lower extremity edema ABDOMEN: abdomen soft, non-tender and normal bowel sounds Musculoskeletal:no cyanosis of digits and no clubbing  PSYCH: alert & oriented x 3 with fluent speech NEURO: no focal motor/sensory deficits SKIN:  no rashes or significant lesions  LABORATORY DATA:  I have reviewed the data as listed Lab Results  Component Value Date   WBC 59.1* 07/02/2015   HGB 12.7* 07/02/2015   HCT 38.5* 07/02/2015   MCV 90.6 07/02/2015   PLT 356 07/02/2015    Recent Labs  06/10/15 1642  07/02/15 1007  NA 133* 128*  K 4.1 5.2*  CL 98* 100*  CO2 26 20*  GLUCOSE 94 141*  BUN 23* 33*  CREATININE 1.08 0.99  CALCIUM 9.0 7.6*  GFRNONAA >60 >60  GFRAA >60 >60  PROT 7.3  --   ALBUMIN 3.1*  --   AST 16  --   ALT 19  --   ALKPHOS 98  --   BILITOT 0.4  --     RADIOGRAPHIC STUDIES: I have personally reviewed the radiological images as listed and agreed with the findings in the report. Mr Jeri Cos QQ Contrast  06/11/2015  CLINICAL DATA:  70 year old male with headache and blurred vision since February. Recently diagnosed right middle lobe mass with right adrenal and bone metastases. Staging. Subsequent encounter. EXAM: MRI HEAD WITHOUT AND WITH CONTRAST TECHNIQUE: Multiplanar, multiecho pulse sequences of the brain and surrounding structures were obtained without and with intravenous contrast. CONTRAST:  60m MULTIHANCE GADOBENATE DIMEGLUMINE 529 MG/ML IV SOLN COMPARISON:  PET-CT 06/04/2015 FINDINGS: Small 3-4 mm round enhancing lesion in the left parietal lobe just posterior to the sensory strip on series 14, image 42. Mild associated vasogenic edema (series 10, image 19) with no associated mass effect. Associated minimal blood products (series 11, image 19) without acute hemorrhage. No other brain metastasis identified. No other abnormal intracranial enhancement. No dural thickening identified. No destructive osseous lesion of the skull. Bone marrow signal at the skullbase is within normal limits. Heterogeneously decreased T1 marrow signal in the visualized upper cervical spine, but no destructive upper cervical osseous lesion identified. Negative visualized cervical spinal cord. No restricted diffusion to suggest acute infarction. No midline shift, ventriculomegaly, extra-axial collection or acute intracranial hemorrhage. Cervicomedullary junction and pituitary are within normal limits. Major intracranial vascular flow voids are within normal limits. Scattered mild for age nonspecific  cerebral white matter T2 and FLAIR hyperintensity, most commonly due to chronic small vessel disease. No cerebral blood products other than that associated with the small left parietal lobe metastasis. Visible internal auditory structures appear normal. Mastoids are clear. Paranasal sinuses are clear. Negative orbit and scalp soft tissues. IMPRESSION: 1. Positive for early metastatic disease to the brain with a solitary 4 mm left parietal lobe metastasis identified. Mild vasogenic edema without mass effect. 2. No osseous metastatic involvement of the skull identified. No other acute intracranial abnormality identified. Electronically Signed   By: HGenevie AnnM.D.   On: 06/11/2015 10:35   Nm Pet Image Initial (pi) Skull Base To Thigh  06/04/2015  CLINICAL DATA:  Initial treatment strategy for lung mass. EXAM: NUCLEAR MEDICINE PET SKULL BASE TO THIGH TECHNIQUE: 12.6 mCi F-18 FDG was injected intravenously. Full-ring PET imaging was performed from the skull base to thigh after the radiotracer. CT data was obtained and used for attenuation correction and anatomic localization. FASTING BLOOD GLUCOSE:  Value: 121 mg/dl COMPARISON:  Chest CT of 05/24/2015. FINDINGS: NECK No areas of abnormal hypermetabolism. CHEST Right hilar hypermetabolism is without correlate adenopathy but suspicious. This measures a S.U.V. max of 3.3, including on image 101/series 3. The dominant right middle lobe lung mass is peripherally hypermetabolic and centrally necrotic. This measures 9.3 x 8.9 cm and a S.U.V. max of 21.1. ABDOMEN/PELVIS Hypermetabolism corresponding to the right adrenal mass. 3.0 x 4.5 cm and a S.U.V. max of 10.2 on image 145/series 3. SKELETON Multifocal osseous metastasis. Examples within the a fifth posterior lateral right rib at a S.U.V. max of 7.4 on image 79/series 3, left side of the L4 vertebral body, at 6.9 x 3.5 cm and a S.U.V. max of 20.1, and right side of the sacrum at 3.1 x 3.0 cm and a S.U.V. max of 12.4. CT  IMAGES PERFORMED FOR ATTENUATION CORRECTION No cervical adenopathy. Chest findings deferred to recent diagnostic CT. Right-sided pleural effusion is similar. Advanced centrilobular emphysema. Multivessel coronary artery atherosclerosis. Low-density left renal lesions which are likely cysts. Irregular hepatic capsule, including on image 149/series 3. Bilateral fat containing inguinal hernias. IMPRESSION: 1. 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 2. Soft tissue mass about the left-sided L4 vertebral body. Consider further  evaluation with pre and post contrast lumbar spine MRI to evaluate for extension into the canal. 3.  Atherosclerosis, including within the coronary arteries. 4. Small right pleural effusion. 5. Suspicion of cirrhosis. Electronically Signed   By: Abigail Miyamoto M.D.   On: 06/04/2015 11:01   Ct Biopsy  06/16/2015  INDICATION: 70 year old male with new diagnosis of lung mass and multifocal metastatic disease. He presents for CT-guided biopsy to confirm tissue diagnosis. EXAM: CT BIOPSY MEDICATIONS: None. ANESTHESIA/SEDATION: Moderate (conscious) sedation was employed during this procedure. A total of Versed 3 mg and Fentanyl 75 mcg was administered intravenously. Moderate Sedation Time: 12 minutes. The patient's level of consciousness and vital signs were monitored continuously by radiology nursing throughout the procedure under my direct supervision. FLUOROSCOPY TIME:  None COMPLICATIONS: None immediate. PROCEDURE: Informed written consent was obtained from the patient after a thorough discussion of the procedural risks, benefits and alternatives. All questions were addressed. Maximal Sterile Barrier Technique was utilized including caps, mask, sterile gowns, sterile gloves, sterile drape, hand hygiene and skin antiseptic. A timeout was performed prior to the initiation of the procedure. A planning axial CT scan was performed. The  soft tissue mass in the left paraspinal muscles at L4 is successfully identified. A suitable skin entry site was selected and marked. Local anesthesia was attained by infiltration with 1% lidocaine. Under intermittent CT fluoroscopic guidance, a 19 gauge introducer needle was advanced into the margin of the soft tissue mass. Multiple 18 gauge core biopsies were then coaxially obtained using the bio Pince automated biopsy device. Biopsy specimens were placed in formalin and delivered to pathology for further analysis. Post biopsy axial CT imaging demonstrates no evidence of hematoma or other acute complication. IMPRESSION: Technically successful CT-guided core biopsy of left L4 paraspinal soft tissue mass. Signed, Criselda Peaches, MD [facility] Electronically Signed   By: Jacqulynn Cadet M.D.   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 Awaiting on PDL 1 testing.  # Recommend carbotaxol chemotherapy every 3 weeks for palliative reasons. Discussed the potential side effects including but not limited to-increasing fatigue, nausea vomiting, diarrhea, hair loss, sores in the mouth, increase risk of infection and also neuropathy. Recommend 4-6 cycles of chemotherapy; and then reevaluation. However patient will likely need ongoing treatments with progression of disease.  # Proceed with chemotherapy in approximately 1 week.  # Pain control- not optimal; patient radiation 2 days ago. I recommend decreasing the fentanyl to 50 g; continue hydrocodone 05/325 every 4-6 hours.   # Brain metastases- solitary left parietal. Planning next week.  # Bone metastases- Cont X-geva every 6 weeks.   # Leukocytosis predominantly neutrophilia white count 58,000- likely paraneoplastic/poor prognostic feature. Given the leukocytosis will hold Neulasta.  # 25  minutes face-to-face with  the patient discussing the above plan of care; more than 50% of time spent on prognosis/ natural history; counseling and coordination.    Cammie Sickle, MD 07/02/2015 10:53 AM

## 2015-07-02 NOTE — Telephone Encounter (Signed)
Contacted [name] x [phone] in pathology to determine when PDL-1 result can be anticipated.  Original path sent to [facility]. Results completed. She will fax to cancer center.

## 2015-07-05 ENCOUNTER — Ambulatory Visit
Admission: RE | Admit: 2015-07-05 | Discharge: 2015-07-05 | Disposition: A | Discharge status: Home / Self Care | Payer: 59 | Source: Ambulatory Visit | Attending: Radiation Oncology | Admitting: Radiation Oncology

## 2015-07-05 DIAGNOSIS — C7951 Secondary malignant neoplasm of bone: Secondary | ICD-10-CM | POA: Diagnosis not present

## 2015-07-05 LAB — SURGICAL PATHOLOGY

## 2015-07-05 NOTE — Patient Instructions (Signed)

## 2015-07-06 ENCOUNTER — Encounter: Payer: Self-pay | Admitting: Anatomic Pathology & Clinical Pathology

## 2015-07-06 ENCOUNTER — Ambulatory Visit
Admission: RE | Admit: 2015-07-06 | Discharge: 2015-07-06 | Disposition: A | Discharge status: Home / Self Care | Payer: 59 | Source: Ambulatory Visit | Attending: Radiation Oncology | Admitting: Radiation Oncology

## 2015-07-06 ENCOUNTER — Inpatient Hospital Stay: Payer: 59

## 2015-07-06 DIAGNOSIS — C7951 Secondary malignant neoplasm of bone: Secondary | ICD-10-CM | POA: Diagnosis not present

## 2015-07-07 ENCOUNTER — Other Ambulatory Visit: Payer: Self-pay | Admitting: *Deleted

## 2015-07-07 ENCOUNTER — Ambulatory Visit
Admission: RE | Admit: 2015-07-07 | Discharge: 2015-07-07 | Disposition: A | Discharge status: Home / Self Care | Payer: 59 | Source: Ambulatory Visit | Attending: Radiation Oncology | Admitting: Radiation Oncology

## 2015-07-07 DIAGNOSIS — C7951 Secondary malignant neoplasm of bone: Secondary | ICD-10-CM | POA: Diagnosis not present

## 2015-07-07 DIAGNOSIS — C3431 Malignant neoplasm of lower lobe, right bronchus or lung: Secondary | ICD-10-CM

## 2015-07-07 DIAGNOSIS — C342 Malignant neoplasm of middle lobe, bronchus or lung: Secondary | ICD-10-CM

## 2015-07-07 MED ORDER — HYDROCODONE-ACETAMINOPHEN 5-325 MG PO TABS: 1.0000 | ORAL_TABLET | ORAL | Status: AC | PRN

## 2015-07-07 MED ORDER — HYDROCODONE-ACETAMINOPHEN 5-325 MG PO TABS: 1.0000 | ORAL_TABLET | ORAL | Status: DC | PRN

## 2015-07-08 ENCOUNTER — Telehealth: Payer: Self-pay | Admitting: *Deleted

## 2015-07-08 ENCOUNTER — Ambulatory Visit
Admission: RE | Admit: 2015-07-08 | Discharge: 2015-07-08 | Disposition: A | Discharge status: Home / Self Care | Payer: 59 | Source: Ambulatory Visit | Attending: Radiation Oncology | Admitting: Radiation Oncology

## 2015-07-08 DIAGNOSIS — C7951 Secondary malignant neoplasm of bone: Secondary | ICD-10-CM | POA: Diagnosis not present

## 2015-07-08 NOTE — Telephone Encounter (Signed)
Called patient and spoke to spouse.  Informed her that patient's WBC is elevated therefore he will not be getting the Onpro tomorrow.  Because he is not getting the Onpro he will not need to take the Claritin.  He will see MD tomorrow for pathology results.  Also informed that FMLA papers have been faxed and original copy is available for patient when he rtc tomorrow.  Verbalized understanding.

## 2015-07-08 NOTE — Telephone Encounter (Signed)
-----   Message from Cammie Sickle, MD sent at 07/02/2015  1:32 PM EDT ----- He will not need Neulasta/onpro because of elevated white count. It is likely from his lung cancer. He does not need to take Claritin. Inform chemotherapy education.

## 2015-07-09 ENCOUNTER — Inpatient Hospital Stay: Payer: 59

## 2015-07-09 ENCOUNTER — Inpatient Hospital Stay (HOSPITAL_BASED_OUTPATIENT_CLINIC_OR_DEPARTMENT_OTHER): Payer: 59 | Admitting: Internal Medicine

## 2015-07-09 ENCOUNTER — Ambulatory Visit
Admission: RE | Admit: 2015-07-09 | Discharge: 2015-07-09 | Disposition: A | Discharge status: Home / Self Care | Payer: 59 | Source: Ambulatory Visit | Attending: Radiation Oncology | Admitting: Radiation Oncology

## 2015-07-09 VITALS — BP 118/74 | HR 93 | Temp 97.6°F

## 2015-07-09 VITALS — BP 123/72 | HR 96 | Temp 97.7°F | Ht 70.3 in | Wt 146.3 lb

## 2015-07-09 DIAGNOSIS — C342 Malignant neoplasm of middle lobe, bronchus or lung: Secondary | ICD-10-CM

## 2015-07-09 DIAGNOSIS — C3491 Malignant neoplasm of unspecified part of right bronchus or lung: Secondary | ICD-10-CM

## 2015-07-09 DIAGNOSIS — D72829 Elevated white blood cell count, unspecified: Secondary | ICD-10-CM

## 2015-07-09 DIAGNOSIS — Z7982 Long term (current) use of aspirin: Secondary | ICD-10-CM

## 2015-07-09 DIAGNOSIS — C7931 Secondary malignant neoplasm of brain: Secondary | ICD-10-CM

## 2015-07-09 DIAGNOSIS — I252 Old myocardial infarction: Secondary | ICD-10-CM

## 2015-07-09 DIAGNOSIS — C7951 Secondary malignant neoplasm of bone: Secondary | ICD-10-CM

## 2015-07-09 DIAGNOSIS — Z79899 Other long term (current) drug therapy: Secondary | ICD-10-CM

## 2015-07-09 DIAGNOSIS — E785 Hyperlipidemia, unspecified: Secondary | ICD-10-CM

## 2015-07-09 DIAGNOSIS — J449 Chronic obstructive pulmonary disease, unspecified: Secondary | ICD-10-CM

## 2015-07-09 DIAGNOSIS — M545 Low back pain: Secondary | ICD-10-CM

## 2015-07-09 DIAGNOSIS — K5900 Constipation, unspecified: Secondary | ICD-10-CM

## 2015-07-09 DIAGNOSIS — C7971 Secondary malignant neoplasm of right adrenal gland: Secondary | ICD-10-CM

## 2015-07-09 DIAGNOSIS — Z87891 Personal history of nicotine dependence: Secondary | ICD-10-CM

## 2015-07-09 DIAGNOSIS — Z923 Personal history of irradiation: Secondary | ICD-10-CM

## 2015-07-09 DIAGNOSIS — C3431 Malignant neoplasm of lower lobe, right bronchus or lung: Secondary | ICD-10-CM

## 2015-07-09 DIAGNOSIS — R630 Anorexia: Secondary | ICD-10-CM

## 2015-07-09 DIAGNOSIS — I2510 Atherosclerotic heart disease of native coronary artery without angina pectoris: Secondary | ICD-10-CM

## 2015-07-09 DIAGNOSIS — G893 Neoplasm related pain (acute) (chronic): Secondary | ICD-10-CM

## 2015-07-09 DIAGNOSIS — I10 Essential (primary) hypertension: Secondary | ICD-10-CM

## 2015-07-09 DIAGNOSIS — J439 Emphysema, unspecified: Secondary | ICD-10-CM

## 2015-07-09 LAB — CBC WITH DIFFERENTIAL/PLATELET
BASOS ABS: 0.1 10*3/uL (ref 0–0.1)
Basophils Relative: 0 %
Eosinophils Absolute: 2.6 10*3/uL — ABNORMAL HIGH (ref 0–0.7)
HEMATOCRIT: 32.9 % — AB (ref 40.0–52.0)
Hemoglobin: 10.9 g/dL — ABNORMAL LOW (ref 13.0–18.0)
Lymphs Abs: 0.3 10*3/uL — ABNORMAL LOW (ref 1.0–3.6)
MCH: 30.2 pg (ref 26.0–34.0)
MCHC: 33.1 g/dL (ref 32.0–36.0)
MCV: 91.4 fL (ref 80.0–100.0)
Monocytes Absolute: 1.3 10*3/uL — ABNORMAL HIGH (ref 0.2–1.0)
Monocytes Relative: 3 %
NEUTROS ABS: 39.9 10*3/uL — AB (ref 1.4–6.5)
PLATELETS: 277 10*3/uL (ref 150–440)
RBC: 3.6 MIL/uL — AB (ref 4.40–5.90)
RDW: 17.6 % — ABNORMAL HIGH (ref 11.5–14.5)
WBC: 44.1 10*3/uL — AB (ref 3.8–10.6)

## 2015-07-09 LAB — COMPREHENSIVE METABOLIC PANEL
ALT: 36 U/L (ref 17–63)
AST: 19 U/L (ref 15–41)
Albumin: 2.5 g/dL — ABNORMAL LOW (ref 3.5–5.0)
Alkaline Phosphatase: 118 U/L (ref 38–126)
Anion gap: 4 — ABNORMAL LOW (ref 5–15)
BUN: 29 mg/dL — AB (ref 6–20)
CHLORIDE: 107 mmol/L (ref 101–111)
CO2: 24 mmol/L (ref 22–32)
CREATININE: 0.9 mg/dL (ref 0.61–1.24)
Calcium: 7.3 mg/dL — ABNORMAL LOW (ref 8.9–10.3)
GFR calc Af Amer: >60 mL/min (ref >60)
Glucose, Bld: 131 mg/dL — ABNORMAL HIGH (ref 65–99)
Potassium: 5.1 mmol/L (ref 3.5–5.1)
Sodium: 135 mmol/L (ref 135–145)
Total Bilirubin: 0.6 mg/dL (ref 0.3–1.2)
Total Protein: 5.8 g/dL — ABNORMAL LOW (ref 6.5–8.1)

## 2015-07-09 MED ORDER — FAMOTIDINE IN NACL 20-0.9 MG/50ML-% IV SOLN
20.0000 mg | Freq: Once | INTRAVENOUS | Status: AC
  Administered 2015-07-09: 20 mg via INTRAVENOUS
  Filled 2015-07-09: qty 50

## 2015-07-09 MED ORDER — SODIUM CHLORIDE 0.9 % IV SOLN
568.2000 mg | Freq: Once | INTRAVENOUS | Status: AC
  Administered 2015-07-09: 570 mg via INTRAVENOUS
  Filled 2015-07-09: qty 57

## 2015-07-09 MED ORDER — SODIUM CHLORIDE 0.9 % IV SOLN
Freq: Once | INTRAVENOUS | Status: AC
  Administered 2015-07-09: 10:00:00 via INTRAVENOUS
  Filled 2015-07-09: qty 1000

## 2015-07-09 MED ORDER — SODIUM CHLORIDE 0.9 % IV SOLN
200.0000 mg/m2 | Freq: Once | INTRAVENOUS | Status: AC
  Administered 2015-07-09: 372 mg via INTRAVENOUS
  Filled 2015-07-09: qty 62

## 2015-07-09 MED ORDER — DIPHENHYDRAMINE HCL 50 MG/ML IJ SOLN
50.0000 mg | Freq: Once | INTRAMUSCULAR | Status: AC
  Administered 2015-07-09: 50 mg via INTRAVENOUS
  Filled 2015-07-09: qty 1

## 2015-07-09 MED ORDER — FENTANYL 25 MCG/HR TD PT72: MEDICATED_PATCH | TRANSDERMAL | Status: AC

## 2015-07-09 MED ORDER — SODIUM CHLORIDE 0.9 % IV SOLN
20.0000 mg | Freq: Once | INTRAVENOUS | Status: AC
  Administered 2015-07-09: 20 mg via INTRAVENOUS
  Filled 2015-07-09: qty 2

## 2015-07-09 MED ORDER — PALONOSETRON HCL INJECTION 0.25 MG/5ML
0.2500 mg | Freq: Once | INTRAVENOUS | Status: AC
  Administered 2015-07-09: 0.25 mg via INTRAVENOUS
  Filled 2015-07-09: qty 5

## 2015-07-09 MED ORDER — SODIUM CHLORIDE 0.9% FLUSH
10.0000 mL | INTRAVENOUS | Status: DC | PRN
  Administered 2015-07-09: 10 mL via INTRAVENOUS
  Filled 2015-07-09: qty 10

## 2015-07-09 MED ORDER — HEPARIN SOD (PORK) LOCK FLUSH 100 UNIT/ML IV SOLN
500.0000 [IU] | Freq: Once | INTRAVENOUS | Status: AC
  Administered 2015-07-09: 500 [IU] via INTRAVENOUS
  Filled 2015-07-09: qty 5

## 2015-07-09 NOTE — Progress Notes (Signed)
Patient ambulates using a wheelchair, accompanied by family.  Patient c/o chronic left leg pain 4 oof 10, vitals documented.  Medication recorded updated information provided by patient and family.

## 2015-07-09 NOTE — Progress Notes (Signed)
[place] CONSULT NOTE  Patient Care Team: No Pcp Per Patient as PCP - General (General Practice)  CHIEF COMPLAINTS/PURPOSE OF CONSULTATION:   # MARCH 2017-RIGHT LUNG CA STAGE IV-[ sacral soft tissue Bx- pleomorphic-poorly diff; PDL-1 pending; mol testing- not possible]- multiple bone metastases; Right adrenal Met; START CARBO-TAXOL q 3W;  PD-L1 immunohistochemistry Tumor proportion score 95%, high expression; April 14th -START carbo-taxol q 3 W  # Left parietal Brain met- 4 mm s/p RT  # Bone metastases/ on RT [finished April 5th]  # Smoker [quit 2016]/COPD/ CAD s/p stent [x2 in 2014]  HISTORY OF PRESENTING ILLNESS:  Mitchell Nguyen 70 y.o.  male with newly diagnosed likely right lung cancer with multiple metastases described above is here for follow-up/to review pathology and also proceed with first cycle of chemotherapy.  Patient is finished status post radiation to the left side of the sacrum/hip however his improvement in pain is suboptimal at this time. Is currently on 50 g of fentanyl/ and is also taking 2 pills of hydrocodone every 4 hours.  Constipation for which she is taking milk of magnesia. Poor appetite. No nausea no vomiting. No headaches. No fevers. Overall feels poorly. Has cough with mild intermittent hemoptysis. Has chronic shortness of breath or any worse.  ROS: A complete 10 point review of system is done which is negative except mentioned above in history of present illness  MEDICAL HISTORY:  Past Medical History  Diagnosis Date  . Hypertension   . MI (myocardial infarction) ([place])     x2 (2012 and 2014) at [facility]  . Hyperlipidemia   . Lung mass   . COPD (chronic obstructive pulmonary disease) ([place])   . Cough   . Emphysema of lung ([place])   . Shortness of breath   . Constipation   . Arthritis   . Low back pain   . ST elevation myocardial infarction (STEMI) ([place])   . History of tobacco abuse   . Hx of cardiac cath     SURGICAL  HISTORY: Past Surgical History  Procedure Laterality Date  . Cardiac surgery      stents placed x2  . Cardiac catheterization    . Peripheral vascular catheterization N/A 06/28/2015    Procedure: Glori Luis Cath Insertion;  Surgeon: Algernon Huxley, MD;  Location: [facility];  Service: Cardiovascular;  Laterality: N/A;    SOCIAL HISTORY: Social History   Social History  . Marital Status: Married    Spouse Name: N/A  . Number of Children: N/A  . Years of Education: N/A   Occupational History  . Not on file.   Social History Main Topics  . Smoking status: Former Smoker -- 1.00 packs/day for 50 years    Types: Cigarettes    Quit date: 11/26/2014  . Smokeless tobacco: Never Used     Comment: "stopped 6 months ago" 2016  . Alcohol Use: No  . Drug Use: No  . Sexual Activity: Not on file   Other Topics Concern  . Not on file   Social History Narrative    FAMILY HISTORY: Family History  Problem Relation Age of Onset  . Hypertension    . Other Brother     "blood disorder"  . Heart failure Mother   . Heart disease Brother   . Heart disease Brother   . Heart disease      ALLERGIES:  is allergic to lisinopril.  MEDICATIONS:  Current Outpatient Prescriptions  Medication Sig Dispense Refill  .  albuterol (PROAIR HFA) 108 (90 Base) MCG/ACT inhaler Inhale 2 Inhalers into the lungs every 6 (six) hours as needed. wheezing    . dexamethasone (DECADRON) 4 MG tablet Take one pill with breakfast/lunch 20 tablet 0  . docusate sodium (COLACE) 100 MG capsule Take 100 mg by mouth 2 (two) times daily. Reported on 06/18/2015    . fentaNYL (DURAGESIC - DOSED MCG/HR) 50 MCG/HR Place 1 patch (50 mcg total) onto the skin every 3 (three) days. 10 patch 0  . HYDROcodone-acetaminophen (NORCO/VICODIN) 5-325 MG tablet Take 1-2 tablets by mouth every 4 (four) hours as needed for moderate pain. 120 tablet 0  . lidocaine-prilocaine (EMLA) cream Apply 1 application topically as needed. Apply to port  then cover with saran wrap 1-2 hours before chemotherapy appointment 30 g 2  . losartan (COZAAR) 50 MG tablet Take 50 mg by mouth daily.    . magnesium hydroxide (MILK OF MAGNESIA) 400 MG/5ML suspension Take 5 mLs by mouth daily as needed for mild constipation.    . metoprolol (LOPRESSOR) 50 MG tablet Take 50 mg by mouth daily.     . NON FORMULARY Take 2 capsules by mouth daily. Reported on 06/18/2015    . ondansetron (ZOFRAN) 8 MG tablet Take 1 tablet (8 mg total) by mouth every 8 (eight) hours as needed for nausea or vomiting (start 3 days; after chemo). 40 tablet 0  . prochlorperazine (COMPAZINE) 10 MG tablet Take 1 tablet (10 mg total) by mouth every 6 (six) hours as needed for nausea or vomiting. 40 tablet 0  . tiotropium (SPIRIVA) 18 MCG inhalation capsule Place 1 capsule into inhaler and inhale daily.    Marland Kitchen aspirin 81 MG tablet Take 81 mg by mouth daily. Reported on 07/09/2015    . polyethylene glycol (MIRALAX / GLYCOLAX) packet Take 17 g by mouth daily as needed for mild constipation. Reported on 07/09/2015     No current facility-administered medications for this visit.   Facility-Administered Medications Ordered in Other Visits  Medication Dose Route Frequency Provider Last Rate Last Dose  . heparin lock flush 100 unit/mL  500 Units Intravenous Once Cammie Sickle, MD      . sodium chloride flush (NS) 0.9 % injection 10 mL  10 mL Intravenous PRN Cammie Sickle, MD          .  PHYSICAL EXAMINATION: ECOG PERFORMANCE STATUS: 1 - Symptomatic but completely ambulatory  Filed Vitals:   07/09/15 0907  BP: 123/72  Pulse: 96  Temp: 97.7 F (36.5 C)   Filed Weights   07/09/15 0907  Weight: 146 lb 4.4 oz (66.35 kg)    GENERAL: Well-nourished well-developed; Alert, no distress and comfortable.   Accompanied by multiple family members including wife sister and son. He is in a wheelchair because of pain. EYES: no pallor or icterus OROPHARYNX: no thrush or ulceration;   NECK: supple, no masses felt LYMPH:  no palpable lymphadenopathy in the cervical, axillary or inguinal regions LUNGS: Decreased breath sounds at the right lower base  No wheeze or crackles HEART/CVS: regular rate & rhythm and no murmurs; No lower extremity edema ABDOMEN: abdomen soft, non-tender and normal bowel sounds Musculoskeletal:no cyanosis of digits and no clubbing  PSYCH: alert & oriented x 3 with fluent speech NEURO: no focal motor/sensory deficits SKIN:  no rashes or significant lesions  LABORATORY DATA:  I have reviewed the data as listed Lab Results  Component Value Date   WBC 44.1* 07/09/2015   HGB 10.9* 07/09/2015  HCT 32.9* 07/09/2015   MCV 91.4 07/09/2015   PLT 277 07/09/2015    Recent Labs  06/10/15 1642 07/02/15 1007 07/09/15 0845  NA 133* 128* 135  K 4.1 5.2* 5.1  CL 98* 100* 107  CO2 26 20* 24  GLUCOSE 94 141* 131*  BUN 23* 33* 29*  CREATININE 1.08 0.99 0.90  CALCIUM 9.0 7.6* 7.3*  GFRNONAA >60 >60 >60  GFRAA >60 >60 >60  PROT 7.3  --  5.8*  ALBUMIN 3.1*  --  2.5*  AST 16  --  19  ALT 19  --  36  ALKPHOS 98  --  118  BILITOT 0.4  --  0.6    RADIOGRAPHIC STUDIES: I have personally reviewed the radiological images as listed and agreed with the findings in the report. Mr Jeri Cos Wo Contrast  06/11/2015  CLINICAL DATA:  70 year old male with headache and blurred vision since February. Recently diagnosed right middle lobe mass with right adrenal and bone metastases. Staging. Subsequent encounter. EXAM: MRI HEAD WITHOUT AND WITH CONTRAST TECHNIQUE: Multiplanar, multiecho pulse sequences of the brain and surrounding structures were obtained without and with intravenous contrast. CONTRAST:  24m MULTIHANCE GADOBENATE DIMEGLUMINE 529 MG/ML IV SOLN COMPARISON:  PET-CT 06/04/2015 FINDINGS: Small 3-4 mm round enhancing lesion in the left parietal lobe just posterior to the sensory strip on series 14, image 42. Mild associated vasogenic edema (series 10, image  19) with no associated mass effect. Associated minimal blood products (series 11, image 19) without acute hemorrhage. No other brain metastasis identified. No other abnormal intracranial enhancement. No dural thickening identified. No destructive osseous lesion of the skull. Bone marrow signal at the skullbase is within normal limits. Heterogeneously decreased T1 marrow signal in the visualized upper cervical spine, but no destructive upper cervical osseous lesion identified. Negative visualized cervical spinal cord. No restricted diffusion to suggest acute infarction. No midline shift, ventriculomegaly, extra-axial collection or acute intracranial hemorrhage. Cervicomedullary junction and pituitary are within normal limits. Major intracranial vascular flow voids are within normal limits. Scattered mild for age nonspecific cerebral white matter T2 and FLAIR hyperintensity, most commonly due to chronic small vessel disease. No cerebral blood products other than that associated with the small left parietal lobe metastasis. Visible internal auditory structures appear normal. Mastoids are clear. Paranasal sinuses are clear. Negative orbit and scalp soft tissues. IMPRESSION: 1. Positive for early metastatic disease to the brain with a solitary 4 mm left parietal lobe metastasis identified. Mild vasogenic edema without mass effect. 2. No osseous metastatic involvement of the skull identified. No other acute intracranial abnormality identified. Electronically Signed   By: HGenevie AnnM.D.   On: 06/11/2015 10:35   Ct Biopsy  06/16/2015  INDICATION: 70year old male with new diagnosis of lung mass and multifocal metastatic disease. He presents for CT-guided biopsy to confirm tissue diagnosis. EXAM: CT BIOPSY MEDICATIONS: None. ANESTHESIA/SEDATION: Moderate (conscious) sedation was employed during this procedure. A total of Versed 3 mg and Fentanyl 75 mcg was administered intravenously. Moderate Sedation Time: 12 minutes. The  patient's level of consciousness and vital signs were monitored continuously by radiology nursing throughout the procedure under my direct supervision. FLUOROSCOPY TIME:  None COMPLICATIONS: None immediate. PROCEDURE: Informed written consent was obtained from the patient after a thorough discussion of the procedural risks, benefits and alternatives. All questions were addressed. Maximal Sterile Barrier Technique was utilized including caps, mask, sterile gowns, sterile gloves, sterile drape, hand hygiene and skin antiseptic. A timeout was performed prior to the  initiation of the procedure. A planning axial CT scan was performed. The soft tissue mass in the left paraspinal muscles at L4 is successfully identified. A suitable skin entry site was selected and marked. Local anesthesia was attained by infiltration with 1% lidocaine. Under intermittent CT fluoroscopic guidance, a 19 gauge introducer needle was advanced into the margin of the soft tissue mass. Multiple 18 gauge core biopsies were then coaxially obtained using the bio Pince automated biopsy device. Biopsy specimens were placed in formalin and delivered to pathology for further analysis. Post biopsy axial CT imaging demonstrates no evidence of hematoma or other acute complication. IMPRESSION: Technically successful CT-guided core biopsy of left L4 paraspinal soft tissue mass. Signed, Criselda Peaches, MD [facility] Radiology Electronically Signed   By: Jacqulynn Cadet M.D.   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 Proceed with carbo Taxol- cycle #1 today. Reviewed with the patient and family that patient would be a candidate for immunotherapy based upon his high PDL 1 score. However given the need for quicker response I would recommend chemotherapy first- for about 4-6 cycles. And then  based on tolerance to therapy would be recommended.  # Pain control- not optimal; go up on the fentanyl to total 75 g every 72 hours [new prescription for 25 g given]. Continue hydrocodone 5/325 one to 2 pills every 4-6 hours  # Brain metastases- solitary left parietal. Status post radiation.  # Bone metastases- Cont X-geva every 6 weeks.   # Leukocytosis predominantly neutrophilia white count 58,000- likely paraneoplastic/poor prognostic feature. Given the leukocytosis will hold Neulasta.   # Follow-up with me in 10 days labs/ follow-up in 3 weeks with labs and M.D. visit chemotherapy.  # Discussed with family [patient's 2 sons/sister] regarding the overall poor prognosis. In general the median life expectancy of metastatic lung cancer is around one year. However given his decline in performance status/ brain metastases- which quite possible that patient might not even survive that longer. However- his life expectancy would be based on response to therapy/side effects from treatments.   Cammie Sickle, MD 07/09/2015 9:23 AM

## 2015-07-11 ENCOUNTER — Telehealth: Payer: Self-pay | Admitting: Hematology and Oncology

## 2015-07-11 ENCOUNTER — Other Ambulatory Visit: Payer: Self-pay | Admitting: Hematology and Oncology

## 2015-07-11 NOTE — Telephone Encounter (Signed)
Re:  Scratchy throat and low grade fever  Patient received chemo on Friday.  Counts included a WBC 44,100 (paraneoplastic process per clinic note).    Today developed a scratchy throat and temp 100.5 then 100.3.  No chills.  Chronic non-productive cough.  Feels good otherwise.  Instructed patient that he could take Tylenol.  If he feels ill or has an increasing temperature or any concerns, he is to call back.   Lequita Asal, MD

## 2015-07-12 ENCOUNTER — Inpatient Hospital Stay: Payer: 59

## 2015-07-12 ENCOUNTER — Telehealth: Payer: Self-pay | Admitting: *Deleted

## 2015-07-12 ENCOUNTER — Ambulatory Visit
Admission: RE | Admit: 2015-07-12 | Discharge: 2015-07-12 | Disposition: A | Discharge status: Home / Self Care | Payer: 59 | Source: Ambulatory Visit | Attending: Internal Medicine | Admitting: Internal Medicine

## 2015-07-12 ENCOUNTER — Inpatient Hospital Stay (HOSPITAL_BASED_OUTPATIENT_CLINIC_OR_DEPARTMENT_OTHER): Payer: 59 | Admitting: Internal Medicine

## 2015-07-12 VITALS — BP 87/61 | HR 126 | Temp 98.6°F | Resp 24

## 2015-07-12 DIAGNOSIS — I2510 Atherosclerotic heart disease of native coronary artery without angina pectoris: Secondary | ICD-10-CM

## 2015-07-12 DIAGNOSIS — J449 Chronic obstructive pulmonary disease, unspecified: Secondary | ICD-10-CM | POA: Insufficient documentation

## 2015-07-12 DIAGNOSIS — C7971 Secondary malignant neoplasm of right adrenal gland: Secondary | ICD-10-CM | POA: Diagnosis not present

## 2015-07-12 DIAGNOSIS — R05 Cough: Secondary | ICD-10-CM | POA: Insufficient documentation

## 2015-07-12 DIAGNOSIS — R066 Hiccough: Secondary | ICD-10-CM | POA: Diagnosis not present

## 2015-07-12 DIAGNOSIS — D72829 Elevated white blood cell count, unspecified: Secondary | ICD-10-CM

## 2015-07-12 DIAGNOSIS — R062 Wheezing: Secondary | ICD-10-CM

## 2015-07-12 DIAGNOSIS — R509 Fever, unspecified: Secondary | ICD-10-CM

## 2015-07-12 DIAGNOSIS — Z923 Personal history of irradiation: Secondary | ICD-10-CM

## 2015-07-12 DIAGNOSIS — E785 Hyperlipidemia, unspecified: Secondary | ICD-10-CM

## 2015-07-12 DIAGNOSIS — I252 Old myocardial infarction: Secondary | ICD-10-CM

## 2015-07-12 DIAGNOSIS — C7951 Secondary malignant neoplasm of bone: Secondary | ICD-10-CM

## 2015-07-12 DIAGNOSIS — C3431 Malignant neoplasm of lower lobe, right bronchus or lung: Secondary | ICD-10-CM

## 2015-07-12 DIAGNOSIS — K5900 Constipation, unspecified: Secondary | ICD-10-CM

## 2015-07-12 DIAGNOSIS — C7931 Secondary malignant neoplasm of brain: Secondary | ICD-10-CM

## 2015-07-12 DIAGNOSIS — Z79899 Other long term (current) drug therapy: Secondary | ICD-10-CM

## 2015-07-12 DIAGNOSIS — C3491 Malignant neoplasm of unspecified part of right bronchus or lung: Secondary | ICD-10-CM

## 2015-07-12 DIAGNOSIS — Z87891 Personal history of nicotine dependence: Secondary | ICD-10-CM

## 2015-07-12 DIAGNOSIS — J439 Emphysema, unspecified: Secondary | ICD-10-CM

## 2015-07-12 DIAGNOSIS — R059 Cough, unspecified: Secondary | ICD-10-CM

## 2015-07-12 DIAGNOSIS — E860 Dehydration: Secondary | ICD-10-CM | POA: Diagnosis not present

## 2015-07-12 DIAGNOSIS — R000 Tachycardia, unspecified: Secondary | ICD-10-CM

## 2015-07-12 DIAGNOSIS — R630 Anorexia: Secondary | ICD-10-CM

## 2015-07-12 DIAGNOSIS — M545 Low back pain: Secondary | ICD-10-CM

## 2015-07-12 DIAGNOSIS — R5383 Other fatigue: Secondary | ICD-10-CM

## 2015-07-12 DIAGNOSIS — Z7982 Long term (current) use of aspirin: Secondary | ICD-10-CM

## 2015-07-12 DIAGNOSIS — I10 Essential (primary) hypertension: Secondary | ICD-10-CM

## 2015-07-12 LAB — CBC WITH DIFFERENTIAL/PLATELET
Basophils Absolute: 0 10*3/uL (ref 0–0.1)
Basophils Relative: 0 %
EOS PCT: 4 %
Eosinophils Absolute: 0.4 10*3/uL (ref 0–0.7)
HCT: 29.8 % — ABNORMAL LOW (ref 40.0–52.0)
Hemoglobin: 10 g/dL — ABNORMAL LOW (ref 13.0–18.0)
LYMPHS PCT: 3 %
Lymphs Abs: 0.3 10*3/uL — ABNORMAL LOW (ref 1.0–3.6)
MCH: 30.7 pg (ref 26.0–34.0)
MCHC: 33.7 g/dL (ref 32.0–36.0)
MCV: 91.1 fL (ref 80.0–100.0)
MONO ABS: 0.1 10*3/uL — AB (ref 0.2–1.0)
Monocytes Relative: 1 %
Neutro Abs: 9.6 10*3/uL — ABNORMAL HIGH (ref 1.4–6.5)
Neutrophils Relative %: 92 %
PLATELETS: 122 10*3/uL — AB (ref 150–440)
RBC: 3.27 MIL/uL — ABNORMAL LOW (ref 4.40–5.90)
RDW: 18 % — AB (ref 11.5–14.5)
WBC: 10.4 10*3/uL (ref 3.8–10.6)

## 2015-07-12 LAB — BASIC METABOLIC PANEL
Anion gap: 4 — ABNORMAL LOW (ref 5–15)
BUN: 46 mg/dL — AB (ref 6–20)
CALCIUM: 7.4 mg/dL — AB (ref 8.9–10.3)
CO2: 23 mmol/L (ref 22–32)
CREATININE: 1.19 mg/dL (ref 0.61–1.24)
Chloride: 101 mmol/L (ref 101–111)
GFR calc Af Amer: >60 mL/min (ref >60)
GLUCOSE: 139 mg/dL — AB (ref 65–99)
Potassium: 5.5 mmol/L — ABNORMAL HIGH (ref 3.5–5.1)
Sodium: 128 mmol/L — ABNORMAL LOW (ref 135–145)

## 2015-07-12 MED ORDER — PREDNISONE 20 MG PO TABS: ORAL_TABLET | ORAL | Status: AC

## 2015-07-12 MED ORDER — HYDROCODONE-ACETAMINOPHEN 5-325 MG PO TABS
2.0000 | ORAL_TABLET | Freq: Once | ORAL | Status: AC
  Administered 2015-07-12: 2 via ORAL
  Filled 2015-07-12: qty 2

## 2015-07-12 MED ORDER — SODIUM CHLORIDE 0.9% FLUSH
10.0000 mL | INTRAVENOUS | Status: DC | PRN
  Administered 2015-07-12: 10 mL via INTRAVENOUS
  Filled 2015-07-12: qty 10

## 2015-07-12 MED ORDER — HEPARIN SOD (PORK) LOCK FLUSH 100 UNIT/ML IV SOLN
500.0000 [IU] | Freq: Once | INTRAVENOUS | Status: AC
  Administered 2015-07-12: 500 [IU] via INTRAVENOUS
  Filled 2015-07-12: qty 5

## 2015-07-12 MED ORDER — SODIUM CHLORIDE 0.9 % IV SOLN
Freq: Once | INTRAVENOUS | Status: AC
  Administered 2015-07-12: 15:00:00 via INTRAVENOUS
  Filled 2015-07-12: qty 1000

## 2015-07-12 MED ORDER — IPRATROPIUM-ALBUTEROL 20-100 MCG/ACT IN AERS: 1.0000 | INHALATION_SPRAY | Freq: Four times a day (QID) | RESPIRATORY_TRACT | Status: AC

## 2015-07-12 MED ORDER — LEVOFLOXACIN 500 MG PO TABS: 500.0000 mg | ORAL_TABLET | Freq: Every day | ORAL | Status: AC

## 2015-07-12 MED ORDER — CHLORPROMAZINE HCL 10 MG PO TABS: ORAL_TABLET | ORAL | Status: AC

## 2015-07-12 NOTE — Telephone Encounter (Signed)
Per Dr B, pt to come in to see NP today. Pt agrees to a 130 appt

## 2015-07-12 NOTE — Telephone Encounter (Signed)
I called to f/u on his call to on call md this weekend, he complains of being weak, sob, abd pain, diarrhea, coughing, sweating, not feeling good at all. Temp 94.3 this morning

## 2015-07-12 NOTE — Progress Notes (Signed)
[facility] CONSULT NOTE  Patient Care Team: No Pcp Per Patient as PCP - General (General Practice)  CHIEF COMPLAINTS/PURPOSE OF CONSULTATION:   # MARCH 2017-RIGHT LUNG CA STAGE IV-[ sacral soft tissue Bx- pleomorphic-poorly diff; PDL-1 pending; mol testing- not possible]- multiple bone metastases; Right adrenal Met; START CARBO-TAXOL q 3W;  PD-L1 immunohistochemistry Tumor proportion score 95%, high expression; April 14th -START carbo-taxol q 3 W  # Left parietal Brain met- 4 mm s/p RT  # Bone metastases/ on RT [finished April 5th]  # Smoker [quit 2016]/COPD/ CAD s/p stent [x2 in 2014]  HISTORY OF PRESENTING ILLNESS:  Mitchell Nguyen 70 y.o.  male with newly diagnosed  Right non-small cell lung cancer with multiple metastases  Status post carbotaxol  Cycle #1 today day #4.    Patient noted to have significant fatigue over the last 2-3 days.  Poor by mouth intake. Possible hiccups.  Mild nausea no vomiting.  No diarrhea possible constipation.    Worsening shortness of breath with cough. No hemoptysis.  No wheezing.  He feels weak all over. Patient had a temperature of 100.4 yesterday.  He feels lightheaded. Poor by mouth intake.  ROS: A complete 10 point review of system is done which is negative except mentioned above in history of present illness  MEDICAL HISTORY:  Past Medical History  Diagnosis Date  . Hypertension   . MI (myocardial infarction) ([place])     x2 (2012 and 2014) at [facility]  . Hyperlipidemia   . Lung mass   . COPD (chronic obstructive pulmonary disease) ([place])   . Cough   . Emphysema of lung ([place])   . Shortness of breath   . Constipation   . Arthritis   . Low back pain   . ST elevation myocardial infarction (STEMI) ([place])   . History of tobacco abuse   . Hx of cardiac cath     SURGICAL HISTORY: Past Surgical History  Procedure Laterality Date  . Cardiac surgery      stents placed x2  . Cardiac catheterization    . Peripheral vascular  catheterization N/A 06/28/2015    Procedure: Glori Luis Cath Insertion;  Surgeon: Algernon Huxley, MD;  Location: [facility];  Service: Cardiovascular;  Laterality: N/A;    SOCIAL HISTORY: Social History   Social History  . Marital Status: Married    Spouse Name: N/A  . Number of Children: N/A  . Years of Education: N/A   Occupational History  . Not on file.   Social History Main Topics  . Smoking status: Former Smoker -- 1.00 packs/day for 50 years    Types: Cigarettes    Quit date: 11/26/2014  . Smokeless tobacco: Never Used     Comment: "stopped 6 months ago" 2016  . Alcohol Use: No  . Drug Use: No  . Sexual Activity: Not on file   Other Topics Concern  . Not on file   Social History Narrative    FAMILY HISTORY: Family History  Problem Relation Age of Onset  . Hypertension    . Other Brother     "blood disorder"  . Heart failure Mother   . Heart disease Brother   . Heart disease Brother   . Heart disease      ALLERGIES:  is allergic to lisinopril.  MEDICATIONS:  Current Outpatient Prescriptions  Medication Sig Dispense Refill  . albuterol (PROAIR HFA) 108 (90 Base) MCG/ACT inhaler Inhale 2 Inhalers into the lungs every  6 (six) hours as needed. wheezing    . aspirin 81 MG tablet Take 81 mg by mouth daily. Reported on 07/09/2015    . dexamethasone (DECADRON) 4 MG tablet Take one pill with breakfast/lunch 20 tablet 0  . docusate sodium (COLACE) 100 MG capsule Take 100 mg by mouth 2 (two) times daily. Reported on 06/18/2015    . fentaNYL (DURAGESIC - DOSED MCG/HR) 25 MCG/HR patch Put it on the skin; every 3 days; use it along with 10m patch [total of 747m every 3 days] 10 patch 0  . fentaNYL (DURAGESIC - DOSED MCG/HR) 50 MCG/HR Place 1 patch (50 mcg total) onto the skin every 3 (three) days. 10 patch 0  . HYDROcodone-acetaminophen (NORCO/VICODIN) 5-325 MG tablet Take 1-2 tablets by mouth every 4 (four) hours as needed for moderate pain. 120 tablet 0  .  lidocaine-prilocaine (EMLA) cream Apply 1 application topically as needed. Apply to port then cover with saran wrap 1-2 hours before chemotherapy appointment 30 g 2  . losartan (COZAAR) 50 MG tablet Take 50 mg by mouth daily.    . magnesium hydroxide (MILK OF MAGNESIA) 400 MG/5ML suspension Take 5 mLs by mouth daily as needed for mild constipation.    . metoprolol (LOPRESSOR) 50 MG tablet Take 50 mg by mouth daily.     . NON FORMULARY Take 2 capsules by mouth daily. Reported on 06/18/2015    . ondansetron (ZOFRAN) 8 MG tablet Take 1 tablet (8 mg total) by mouth every 8 (eight) hours as needed for nausea or vomiting (start 3 days; after chemo). 40 tablet 0  . polyethylene glycol (MIRALAX / GLYCOLAX) packet Take 17 g by mouth daily as needed for mild constipation. Reported on 07/09/2015    . prochlorperazine (COMPAZINE) 10 MG tablet Take 1 tablet (10 mg total) by mouth every 6 (six) hours as needed for nausea or vomiting. 40 tablet 0  . tiotropium (SPIRIVA) 18 MCG inhalation capsule Place 1 capsule into inhaler and inhale daily.     No current facility-administered medications for this visit.      . Marland KitchenPHYSICAL EXAMINATION: ECOG PERFORMANCE STATUS: 1 - Symptomatic but completely ambulatory  Filed Vitals:   07/12/15 1356  BP: 87/61  Pulse: 126  Temp: 98.6 F (37 C)   There were no vitals filed for this visit.  GENERAL: Well-nourished well-developed; Alert, no distress and comfortable.   Accompanied by multiple family members including wife sister and son. He is in a wheelchair because of pain. EYES: no pallor or icterus OROPHARYNX: no thrush or ulceration;  NECK: supple, no masses felt LYMPH:  no palpable lymphadenopathy in the cervical, axillary or inguinal regions LUNGS: Decreased breath sounds at the right lower base;  Bilateral coarse breath sounds.  HEART/CVS:  Tachycardic regularrhythm and no murmurs; No lower extremity edema ABDOMEN: abdomen soft, non-tender and normal bowel  sounds Musculoskeletal:no cyanosis of digits and no clubbing  PSYCH: alert & oriented x 3 with fluent speech NEURO: no focal motor/sensory deficits SKIN:  no rashes or significant lesions  LABORATORY DATA:  I have reviewed the data as listed Lab Results  Component Value Date   WBC 44.1* 07/09/2015   HGB 10.9* 07/09/2015   HCT 32.9* 07/09/2015   MCV 91.4 07/09/2015   PLT 277 07/09/2015    Recent Labs  06/10/15 1642 07/02/15 1007 07/09/15 0845  NA 133* 128* 135  K 4.1 5.2* 5.1  CL 98* 100* 107  CO2 26 20* 24  GLUCOSE 94 141* 131*  BUN 23* 33* 29*  CREATININE 1.08 0.99 0.90  CALCIUM 9.0 7.6* 7.3*  GFRNONAA >60 >60 >60  GFRAA >60 >60 >60  PROT 7.3  --  5.8*  ALBUMIN 3.1*  --  2.5*  AST 16  --  19  ALT 19  --  36  ALKPHOS 98  --  118  BILITOT 0.4  --  0.6    RADIOGRAPHIC STUDIES: I have personally reviewed the radiological images as listed and agreed with the findings in the report. Ct Biopsy  06/16/2015  INDICATION: 70 year old male with new diagnosis of lung mass and multifocal metastatic disease. He presents for CT-guided biopsy to confirm tissue diagnosis. EXAM: CT BIOPSY MEDICATIONS: None. ANESTHESIA/SEDATION: Moderate (conscious) sedation was employed during this procedure. A total of Versed 3 mg and Fentanyl 75 mcg was administered intravenously. Moderate Sedation Time: 12 minutes. The patient's level of consciousness and vital signs were monitored continuously by radiology nursing throughout the procedure under my direct supervision. FLUOROSCOPY TIME:  None COMPLICATIONS: None immediate. PROCEDURE: Informed written consent was obtained from the patient after a thorough discussion of the procedural risks, benefits and alternatives. All questions were addressed. Maximal Sterile Barrier Technique was utilized including caps, mask, sterile gowns, sterile gloves, sterile drape, hand hygiene and skin antiseptic. A timeout was performed prior to the initiation of the procedure.  A planning axial CT scan was performed. The soft tissue mass in the left paraspinal muscles at L4 is successfully identified. A suitable skin entry site was selected and marked. Local anesthesia was attained by infiltration with 1% lidocaine. Under intermittent CT fluoroscopic guidance, a 19 gauge introducer needle was advanced into the margin of the soft tissue mass. Multiple 18 gauge core biopsies were then coaxially obtained using the bio Pince automated biopsy device. Biopsy specimens were placed in formalin and delivered to pathology for further analysis. Post biopsy axial CT imaging demonstrates no evidence of hematoma or other acute complication. IMPRESSION: Technically successful CT-guided core biopsy of left L4 paraspinal soft tissue mass. Signed, Criselda Peaches, MD [facility] Radiology Electronically Signed   By: Jacqulynn Cadet M.D.   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  Status post carbotaxol cycle #1;  Today day #4.  Patient tolerated chemotherapy poorly [ C discussion below].   If the patient does not recuperate/ continues to have multiple problems with chemotherapy-  I plan to switch the patient to immunotherapy as patient is PD- L1 positive.  #  Extreme fatigue/  And shortness of breath with exertion and cough/ low-grade temperature yesterday.  Question COPD exacerbation.  Check chest x-ray.  Prednisone 60 mg once a day for 7 days.  Also  Levaquin 500 mg once a day.  Also  Combivent inhaler and nebulizer.  #  Tachycardia/ dehydration/ and poor by mouth intake-  Recommend IV fluids today.  #  Hiccups-  Secondary chemotherapy;  Recommended Thorazine as needed.  # Pain control-  Stable on fentanyl to total 75 g every 72 hours;  Continued breakthrough pain medication as needed.  # Brain metastases- solitary left  parietal. Status post radiation.  #  Follow-up in approximately 1 week as planned.   Cammie Sickle, MD 07/12/2015 2:17 PM  Addendum:  Patient was hypotensive-  With systolic mid 96P post IV fluids.  Recommend discontinuation of  Metoprolol Cozaar until next visit. Recommend checking blood pressure at  home.  Also spoke to pharmacy regarding interaction with Compazine and Levaquin [ QT prolongation];  Recommend not using Compazine while taking Levaquin.

## 2015-07-13 ENCOUNTER — Telehealth: Payer: Self-pay | Admitting: *Deleted

## 2015-07-13 ENCOUNTER — Inpatient Hospital Stay
Admission: EM | Admit: 2015-07-13 | Discharge: 2015-07-26 | DRG: 871 | Disposition: E | Payer: 59 | Attending: Internal Medicine | Admitting: Internal Medicine

## 2015-07-13 ENCOUNTER — Other Ambulatory Visit: Payer: Self-pay

## 2015-07-13 ENCOUNTER — Encounter: Admission: EM | Disposition: E | Payer: Self-pay | Source: Home / Self Care | Attending: Internal Medicine

## 2015-07-13 ENCOUNTER — Encounter: Payer: Self-pay | Admitting: Internal Medicine

## 2015-07-13 ENCOUNTER — Emergency Department: Payer: 59

## 2015-07-13 DIAGNOSIS — N170 Acute kidney failure with tubular necrosis: Secondary | ICD-10-CM | POA: Diagnosis present

## 2015-07-13 DIAGNOSIS — Z955 Presence of coronary angioplasty implant and graft: Secondary | ICD-10-CM

## 2015-07-13 DIAGNOSIS — Z7982 Long term (current) use of aspirin: Secondary | ICD-10-CM | POA: Diagnosis not present

## 2015-07-13 DIAGNOSIS — Z9889 Other specified postprocedural states: Secondary | ICD-10-CM

## 2015-07-13 DIAGNOSIS — J441 Chronic obstructive pulmonary disease with (acute) exacerbation: Secondary | ICD-10-CM

## 2015-07-13 DIAGNOSIS — Z515 Encounter for palliative care: Secondary | ICD-10-CM | POA: Diagnosis present

## 2015-07-13 DIAGNOSIS — J449 Chronic obstructive pulmonary disease, unspecified: Secondary | ICD-10-CM | POA: Diagnosis not present

## 2015-07-13 DIAGNOSIS — Y95 Nosocomial condition: Secondary | ICD-10-CM | POA: Diagnosis present

## 2015-07-13 DIAGNOSIS — Z66 Do not resuscitate: Secondary | ICD-10-CM | POA: Diagnosis present

## 2015-07-13 DIAGNOSIS — I252 Old myocardial infarction: Secondary | ICD-10-CM | POA: Diagnosis not present

## 2015-07-13 DIAGNOSIS — J189 Pneumonia, unspecified organism: Secondary | ICD-10-CM | POA: Diagnosis present

## 2015-07-13 DIAGNOSIS — J440 Chronic obstructive pulmonary disease with acute lower respiratory infection: Secondary | ICD-10-CM | POA: Diagnosis present

## 2015-07-13 DIAGNOSIS — R7989 Other specified abnormal findings of blood chemistry: Secondary | ICD-10-CM | POA: Diagnosis present

## 2015-07-13 DIAGNOSIS — E871 Hypo-osmolality and hyponatremia: Secondary | ICD-10-CM | POA: Diagnosis present

## 2015-07-13 DIAGNOSIS — I10 Essential (primary) hypertension: Secondary | ICD-10-CM | POA: Diagnosis present

## 2015-07-13 DIAGNOSIS — E860 Dehydration: Secondary | ICD-10-CM | POA: Diagnosis present

## 2015-07-13 DIAGNOSIS — N179 Acute kidney failure, unspecified: Secondary | ICD-10-CM | POA: Diagnosis not present

## 2015-07-13 DIAGNOSIS — K219 Gastro-esophageal reflux disease without esophagitis: Secondary | ICD-10-CM | POA: Diagnosis present

## 2015-07-13 DIAGNOSIS — Z888 Allergy status to other drugs, medicaments and biological substances status: Secondary | ICD-10-CM

## 2015-07-13 DIAGNOSIS — A419 Sepsis, unspecified organism: Secondary | ICD-10-CM | POA: Diagnosis present

## 2015-07-13 DIAGNOSIS — I4891 Unspecified atrial fibrillation: Secondary | ICD-10-CM | POA: Diagnosis present

## 2015-07-13 DIAGNOSIS — C7951 Secondary malignant neoplasm of bone: Secondary | ICD-10-CM | POA: Diagnosis present

## 2015-07-13 DIAGNOSIS — E872 Acidosis: Secondary | ICD-10-CM | POA: Diagnosis present

## 2015-07-13 DIAGNOSIS — R0600 Dyspnea, unspecified: Secondary | ICD-10-CM

## 2015-07-13 DIAGNOSIS — R042 Hemoptysis: Secondary | ICD-10-CM | POA: Diagnosis present

## 2015-07-13 DIAGNOSIS — R748 Abnormal levels of other serum enzymes: Secondary | ICD-10-CM | POA: Diagnosis present

## 2015-07-13 DIAGNOSIS — R000 Tachycardia, unspecified: Secondary | ICD-10-CM | POA: Diagnosis present

## 2015-07-13 DIAGNOSIS — R0902 Hypoxemia: Secondary | ICD-10-CM

## 2015-07-13 DIAGNOSIS — Z87891 Personal history of nicotine dependence: Secondary | ICD-10-CM

## 2015-07-13 DIAGNOSIS — I2510 Atherosclerotic heart disease of native coronary artery without angina pectoris: Secondary | ICD-10-CM | POA: Diagnosis present

## 2015-07-13 DIAGNOSIS — E875 Hyperkalemia: Secondary | ICD-10-CM | POA: Diagnosis not present

## 2015-07-13 DIAGNOSIS — Z8249 Family history of ischemic heart disease and other diseases of the circulatory system: Secondary | ICD-10-CM | POA: Diagnosis not present

## 2015-07-13 DIAGNOSIS — D702 Other drug-induced agranulocytosis: Secondary | ICD-10-CM

## 2015-07-13 DIAGNOSIS — J9621 Acute and chronic respiratory failure with hypoxia: Secondary | ICD-10-CM | POA: Diagnosis present

## 2015-07-13 DIAGNOSIS — T451X5A Adverse effect of antineoplastic and immunosuppressive drugs, initial encounter: Secondary | ICD-10-CM | POA: Diagnosis present

## 2015-07-13 DIAGNOSIS — Z79899 Other long term (current) drug therapy: Secondary | ICD-10-CM | POA: Diagnosis not present

## 2015-07-13 DIAGNOSIS — I213 ST elevation (STEMI) myocardial infarction of unspecified site: Secondary | ICD-10-CM

## 2015-07-13 DIAGNOSIS — K567 Ileus, unspecified: Secondary | ICD-10-CM | POA: Diagnosis present

## 2015-07-13 DIAGNOSIS — E43 Unspecified severe protein-calorie malnutrition: Secondary | ICD-10-CM | POA: Insufficient documentation

## 2015-07-13 DIAGNOSIS — J9601 Acute respiratory failure with hypoxia: Secondary | ICD-10-CM | POA: Diagnosis present

## 2015-07-13 DIAGNOSIS — R140 Abdominal distension (gaseous): Secondary | ICD-10-CM

## 2015-07-13 DIAGNOSIS — S32049A Unspecified fracture of fourth lumbar vertebra, initial encounter for closed fracture: Secondary | ICD-10-CM | POA: Diagnosis not present

## 2015-07-13 DIAGNOSIS — R0603 Acute respiratory distress: Secondary | ICD-10-CM | POA: Diagnosis present

## 2015-07-13 DIAGNOSIS — R630 Anorexia: Secondary | ICD-10-CM | POA: Diagnosis present

## 2015-07-13 DIAGNOSIS — I214 Non-ST elevation (NSTEMI) myocardial infarction: Secondary | ICD-10-CM | POA: Diagnosis not present

## 2015-07-13 DIAGNOSIS — Z681 Body mass index (BMI) 19 or less, adult: Secondary | ICD-10-CM

## 2015-07-13 DIAGNOSIS — M1990 Unspecified osteoarthritis, unspecified site: Secondary | ICD-10-CM | POA: Diagnosis present

## 2015-07-13 DIAGNOSIS — J9690 Respiratory failure, unspecified, unspecified whether with hypoxia or hypercapnia: Secondary | ICD-10-CM

## 2015-07-13 DIAGNOSIS — D701 Agranulocytosis secondary to cancer chemotherapy: Secondary | ICD-10-CM | POA: Diagnosis present

## 2015-07-13 DIAGNOSIS — C3411 Malignant neoplasm of upper lobe, right bronchus or lung: Secondary | ICD-10-CM | POA: Diagnosis present

## 2015-07-13 DIAGNOSIS — Z9221 Personal history of antineoplastic chemotherapy: Secondary | ICD-10-CM

## 2015-07-13 DIAGNOSIS — Z4659 Encounter for fitting and adjustment of other gastrointestinal appliance and device: Secondary | ICD-10-CM

## 2015-07-13 DIAGNOSIS — E785 Hyperlipidemia, unspecified: Secondary | ICD-10-CM | POA: Diagnosis present

## 2015-07-13 DIAGNOSIS — R002 Palpitations: Secondary | ICD-10-CM | POA: Diagnosis present

## 2015-07-13 DIAGNOSIS — C3490 Malignant neoplasm of unspecified part of unspecified bronchus or lung: Secondary | ICD-10-CM | POA: Diagnosis not present

## 2015-07-13 DIAGNOSIS — C7931 Secondary malignant neoplasm of brain: Secondary | ICD-10-CM | POA: Diagnosis present

## 2015-07-13 DIAGNOSIS — D61811 Other drug-induced pancytopenia: Secondary | ICD-10-CM

## 2015-07-13 HISTORY — PX: CARDIAC CATHETERIZATION: SHX172

## 2015-07-13 LAB — CBC WITH DIFFERENTIAL/PLATELET
BLASTS: 0 %
Band Neutrophils: 17 %
Basophils Absolute: 0 10*3/uL (ref 0–0.1)
Basophils Relative: 0 %
Eosinophils Absolute: 0.2 10*3/uL (ref 0–0.7)
Eosinophils Relative: 8 %
HEMATOCRIT: 27.9 % — AB (ref 40.0–52.0)
HEMOGLOBIN: 9.2 g/dL — AB (ref 13.0–18.0)
LYMPHS PCT: 5 %
Lymphs Abs: 0.1 10*3/uL — ABNORMAL LOW (ref 1.0–3.6)
MCH: 30 pg (ref 26.0–34.0)
MCHC: 32.9 g/dL (ref 32.0–36.0)
MCV: 91.4 fL (ref 80.0–100.0)
MONOS PCT: 2 %
Metamyelocytes Relative: 10 %
Monocytes Absolute: 0 10*3/uL — ABNORMAL LOW (ref 0.2–1.0)
Myelocytes: 1 %
NEUTROS ABS: 1.9 10*3/uL (ref 1.4–6.5)
NRBC: 0 /100{WBCs}
Neutrophils Relative %: 57 %
OTHER: 0 %
Platelets: 89 10*3/uL — ABNORMAL LOW (ref 150–440)
Promyelocytes Absolute: 0 %
RBC: 3.06 MIL/uL — AB (ref 4.40–5.90)
RDW: 18.5 % — ABNORMAL HIGH (ref 11.5–14.5)
WBC: 2.2 10*3/uL — AB (ref 3.8–10.6)

## 2015-07-13 LAB — GLUCOSE, CAPILLARY: Glucose-Capillary: 243 mg/dL — ABNORMAL HIGH (ref 65–99)

## 2015-07-13 LAB — BASIC METABOLIC PANEL
Anion gap: 8 (ref 5–15)
Anion gap: 9 (ref 5–15)
BUN: 53 mg/dL — AB (ref 6–20)
BUN: 54 mg/dL — ABNORMAL HIGH (ref 6–20)
CALCIUM: 7.1 mg/dL — AB (ref 8.9–10.3)
CHLORIDE: 105 mmol/L (ref 101–111)
CHLORIDE: 104 mmol/L (ref 101–111)
CO2: 20 mmol/L — AB (ref 22–32)
CO2: 19 mmol/L — AB (ref 22–32)
CREATININE: 1.41 mg/dL — AB (ref 0.61–1.24)
CREATININE: 1.35 mg/dL — AB (ref 0.61–1.24)
Calcium: 7.3 mg/dL — ABNORMAL LOW (ref 8.9–10.3)
GFR calc Af Amer: >60 mL/min (ref >60)
GFR calc non Af Amer: 50 mL/min — ABNORMAL LOW (ref >60)
GFR calc non Af Amer: 53 mL/min — ABNORMAL LOW (ref >60)
GFR, EST AFRICAN AMERICAN: 58 mL/min — AB (ref >60)
GLUCOSE: 157 mg/dL — AB (ref 65–99)
Glucose, Bld: 133 mg/dL — ABNORMAL HIGH (ref 65–99)
Potassium: 6.1 mmol/L — ABNORMAL HIGH (ref 3.5–5.1)
Potassium: 6.3 mmol/L — ABNORMAL HIGH (ref 3.5–5.1)
Sodium: 133 mmol/L — ABNORMAL LOW (ref 135–145)
Sodium: 132 mmol/L — ABNORMAL LOW (ref 135–145)

## 2015-07-13 LAB — CBC
HEMATOCRIT: 26.6 % — AB (ref 40.0–52.0)
HEMOGLOBIN: 8.7 g/dL — AB (ref 13.0–18.0)
MCH: 29.8 pg (ref 26.0–34.0)
MCHC: 32.6 g/dL (ref 32.0–36.0)
MCV: 91.6 fL (ref 80.0–100.0)
Platelets: 81 10*3/uL — ABNORMAL LOW (ref 150–440)
RBC: 2.91 MIL/uL — ABNORMAL LOW (ref 4.40–5.90)
RDW: 18 % — AB (ref 11.5–14.5)
WBC: 1.9 10*3/uL — ABNORMAL LOW (ref 3.8–10.6)

## 2015-07-13 LAB — PROTIME-INR
INR: 1.15
INR: 1.51
PROTHROMBIN TIME: 14.9 s (ref 11.4–15.0)
Prothrombin Time: 18.3 seconds — ABNORMAL HIGH (ref 11.4–15.0)

## 2015-07-13 LAB — URINALYSIS COMPLETE WITH MICROSCOPIC (ARMC ONLY)
Bacteria, UA: NONE SEEN
Bilirubin Urine: NEGATIVE
GLUCOSE, UA: NEGATIVE mg/dL
HGB URINE DIPSTICK: NEGATIVE
KETONES UR: NEGATIVE mg/dL
LEUKOCYTES UA: NEGATIVE
Nitrite: NEGATIVE
Protein, ur: NEGATIVE mg/dL
RBC / HPF: NONE SEEN RBC/hpf (ref 0–5)
SPECIFIC GRAVITY, URINE: 1.035 — AB (ref 1.005–1.030)
pH: 5 (ref 5.0–8.0)

## 2015-07-13 LAB — LACTIC ACID, PLASMA: LACTIC ACID, VENOUS: 1.6 mmol/L (ref 0.5–2.0)

## 2015-07-13 LAB — POTASSIUM: Potassium: 5.6 mmol/L — ABNORMAL HIGH (ref 3.5–5.1)

## 2015-07-13 LAB — TROPONIN I: Troponin I: 0.11 ng/mL — ABNORMAL HIGH (ref <0.031)

## 2015-07-13 LAB — MRSA PCR SCREENING: MRSA by PCR: NEGATIVE

## 2015-07-13 LAB — APTT
aPTT: 35 seconds (ref 24–36)
aPTT: 65 seconds — ABNORMAL HIGH (ref 24–36)

## 2015-07-13 LAB — VANCOMYCIN, TROUGH: Vancomycin Tr: 30 ug/mL (ref 10–20)

## 2015-07-13 SURGERY — LEFT HEART CATH AND CORONARY ANGIOGRAPHY
Anesthesia: Moderate Sedation

## 2015-07-13 MED ORDER — DEXAMETHASONE SODIUM PHOSPHATE 10 MG/ML IJ SOLN
12.0000 mg | Freq: Once | INTRAMUSCULAR | Status: AC
  Administered 2015-07-13: 12 mg via INTRAVENOUS
  Filled 2015-07-13: qty 2

## 2015-07-13 MED ORDER — TICAGRELOR 90 MG PO TABS
ORAL_TABLET | ORAL | Status: DC | PRN
  Administered 2015-07-13: 180 mg via ORAL

## 2015-07-13 MED ORDER — PANTOPRAZOLE SODIUM 40 MG PO TBEC
40.0000 mg | DELAYED_RELEASE_TABLET | Freq: Every day | ORAL | Status: DC
  Administered 2015-07-13: 40 mg via ORAL
  Filled 2015-07-13: qty 1

## 2015-07-13 MED ORDER — SODIUM CHLORIDE 0.9% FLUSH: 3.0000 mL | INTRAVENOUS | Status: DC | PRN

## 2015-07-13 MED ORDER — PIPERACILLIN-TAZOBACTAM 3.375 G IVPB
3.3750 g | Freq: Three times a day (TID) | INTRAVENOUS | Status: DC
  Administered 2015-07-13 – 2015-07-16 (×9): 3.375 g via INTRAVENOUS
  Filled 2015-07-13 (×11): qty 50

## 2015-07-13 MED ORDER — ASPIRIN 81 MG PO CHEW
CHEWABLE_TABLET | ORAL | Status: AC
  Filled 2015-07-13: qty 4

## 2015-07-13 MED ORDER — PIPERACILLIN-TAZOBACTAM 3.375 G IVPB 30 MIN
3.3750 g | Freq: Once | INTRAVENOUS | Status: DC
  Filled 2015-07-13 (×2): qty 50

## 2015-07-13 MED ORDER — FAMOTIDINE 40 MG/5ML PO SUSR
20.0000 mg | Freq: Two times a day (BID) | ORAL | Status: DC
  Filled 2015-07-13 (×2): qty 2.5

## 2015-07-13 MED ORDER — MIDAZOLAM HCL 2 MG/2ML IJ SOLN
INTRAMUSCULAR | Status: AC
  Filled 2015-07-13: qty 2

## 2015-07-13 MED ORDER — BUDESONIDE 0.5 MG/2ML IN SUSP
0.5000 mg | Freq: Two times a day (BID) | RESPIRATORY_TRACT | Status: DC
  Administered 2015-07-13 – 2015-07-17 (×8): 0.5 mg via RESPIRATORY_TRACT
  Filled 2015-07-13 (×10): qty 2

## 2015-07-13 MED ORDER — VANCOMYCIN HCL IN DEXTROSE 1-5 GM/200ML-% IV SOLN
1000.0000 mg | INTRAVENOUS | Status: DC
  Administered 2015-07-13: 1000 mg via INTRAVENOUS
  Filled 2015-07-13 (×2): qty 200

## 2015-07-13 MED ORDER — SODIUM CHLORIDE 0.9 % WEIGHT BASED INFUSION: 1.0000 mL/kg/h | INTRAVENOUS | Status: DC

## 2015-07-13 MED ORDER — PIPERACILLIN-TAZOBACTAM 3.375 G IVPB
3.3750 g | INTRAVENOUS | Status: DC
  Filled 2015-07-13: qty 50

## 2015-07-13 MED ORDER — VANCOMYCIN HCL IN DEXTROSE 1-5 GM/200ML-% IV SOLN
1000.0000 mg | Freq: Once | INTRAVENOUS | Status: AC
  Administered 2015-07-13: 1000 mg via INTRAVENOUS
  Filled 2015-07-13 (×2): qty 200

## 2015-07-13 MED ORDER — SODIUM CHLORIDE 0.9 % IV SOLN
250.0000 mg | INTRAVENOUS | Status: DC | PRN
  Administered 2015-07-13: 1.75 mg/kg/h via INTRAVENOUS

## 2015-07-13 MED ORDER — SODIUM POLYSTYRENE SULFONATE 15 GM/60ML PO SUSP
15.0000 g | Freq: Once | ORAL | Status: AC
  Administered 2015-07-13: 15 g via ORAL
  Filled 2015-07-13: qty 60

## 2015-07-13 MED ORDER — ASPIRIN 81 MG PO CHEW
324.0000 mg | CHEWABLE_TABLET | Freq: Once | ORAL | Status: AC
  Administered 2015-07-13: 324 mg via ORAL

## 2015-07-13 MED ORDER — IOPAMIDOL (ISOVUE-300) INJECTION 61%
INTRAVENOUS | Status: DC | PRN
  Administered 2015-07-13: 130 mL via INTRA_ARTERIAL

## 2015-07-13 MED ORDER — SODIUM CHLORIDE 0.9% FLUSH: 3.0000 mL | Freq: Two times a day (BID) | INTRAVENOUS | Status: DC

## 2015-07-13 MED ORDER — BIVALIRUDIN 250 MG IV SOLR
INTRAVENOUS | Status: AC
  Filled 2015-07-13: qty 250

## 2015-07-13 MED ORDER — ACETAMINOPHEN 325 MG PO TABS: 650.0000 mg | ORAL_TABLET | ORAL | Status: DC | PRN

## 2015-07-13 MED ORDER — ASPIRIN 81 MG PO CHEW
CHEWABLE_TABLET | ORAL | Status: DC | PRN
  Administered 2015-07-13: 324 mg via ORAL

## 2015-07-13 MED ORDER — SODIUM CHLORIDE 0.9 % IV SOLN: 250.0000 mL | INTRAVENOUS | Status: DC | PRN

## 2015-07-13 MED ORDER — TICAGRELOR 90 MG PO TABS
ORAL_TABLET | ORAL | Status: AC
  Filled 2015-07-13: qty 2

## 2015-07-13 MED ORDER — IPRATROPIUM BROMIDE 0.02 % IN SOLN
0.5000 mg | Freq: Four times a day (QID) | RESPIRATORY_TRACT | Status: DC
  Administered 2015-07-13 – 2015-07-17 (×16): 0.5 mg via RESPIRATORY_TRACT
  Filled 2015-07-13 (×17): qty 2.5

## 2015-07-13 MED ORDER — HEPARIN (PORCINE) IN NACL 2-0.9 UNIT/ML-% IJ SOLN
INTRAMUSCULAR | Status: AC
  Filled 2015-07-13: qty 500

## 2015-07-13 MED ORDER — ONDANSETRON HCL 4 MG/2ML IJ SOLN
4.0000 mg | Freq: Four times a day (QID) | INTRAMUSCULAR | Status: DC | PRN
  Administered 2015-07-13: 4 mg via INTRAVENOUS
  Filled 2015-07-13: qty 2

## 2015-07-13 MED ORDER — ASPIRIN 81 MG PO CHEW: 81.0000 mg | CHEWABLE_TABLET | ORAL | Status: DC

## 2015-07-13 MED ORDER — ALBUTEROL SULFATE (2.5 MG/3ML) 0.083% IN NEBU
2.5000 mg | INHALATION_SOLUTION | Freq: Once | RESPIRATORY_TRACT | Status: AC
  Administered 2015-07-13: 2.5 mg via RESPIRATORY_TRACT

## 2015-07-13 MED ORDER — ALBUTEROL SULFATE (2.5 MG/3ML) 0.083% IN NEBU
INHALATION_SOLUTION | RESPIRATORY_TRACT | Status: AC
  Administered 2015-07-13: 2.5 mg via RESPIRATORY_TRACT
  Filled 2015-07-13: qty 3

## 2015-07-13 MED ORDER — SODIUM CHLORIDE 0.9% FLUSH
3.0000 mL | Freq: Two times a day (BID) | INTRAVENOUS | Status: DC
  Administered 2015-07-13 – 2015-07-16 (×6): 3 mL via INTRAVENOUS

## 2015-07-13 MED ORDER — SODIUM CHLORIDE 0.9 % IV SOLN
INTRAVENOUS | Status: DC
Start: 2015-07-13 — End: 2015-07-16
  Administered 2015-07-13 – 2015-07-14 (×3): via INTRAVENOUS

## 2015-07-13 MED ORDER — SODIUM CHLORIDE 0.9 % IV BOLUS (SEPSIS): 1000.0000 mL | INTRAVENOUS | Status: DC

## 2015-07-13 MED ORDER — METHYLPREDNISOLONE SODIUM SUCC 125 MG IJ SOLR
60.0000 mg | Freq: Two times a day (BID) | INTRAMUSCULAR | Status: DC
  Administered 2015-07-13 – 2015-07-17 (×8): 60 mg via INTRAVENOUS
  Filled 2015-07-13 (×8): qty 2

## 2015-07-13 MED ORDER — SODIUM CHLORIDE 0.9 % WEIGHT BASED INFUSION: 3.0000 mL/kg/h | INTRAVENOUS | Status: DC

## 2015-07-13 MED ORDER — CETYLPYRIDINIUM CHLORIDE 0.05 % MT LIQD
7.0000 mL | Freq: Two times a day (BID) | OROMUCOSAL | Status: DC
  Administered 2015-07-14 – 2015-07-16 (×5): 7 mL via OROMUCOSAL

## 2015-07-13 MED ORDER — SODIUM CHLORIDE 0.9 % IV SOLN
INTRAVENOUS | Status: DC
  Administered 2015-07-13: 20 mL via INTRAVENOUS

## 2015-07-13 MED ORDER — HEPARIN SODIUM (PORCINE) 5000 UNIT/ML IJ SOLN
5000.0000 [IU] | Freq: Three times a day (TID) | INTRAMUSCULAR | Status: DC
  Administered 2015-07-13: 5000 [IU] via SUBCUTANEOUS
  Filled 2015-07-13: qty 1

## 2015-07-13 MED ORDER — FAMOTIDINE 40 MG/5ML PO SUSR
20.0000 mg | Freq: Two times a day (BID) | ORAL | Status: DC
  Administered 2015-07-13 – 2015-07-16 (×6): 20 mg via ORAL
  Filled 2015-07-13 (×9): qty 2.5

## 2015-07-13 MED ORDER — SODIUM CHLORIDE 0.9 % WEIGHT BASED INFUSION
3.0000 mL/kg/h | INTRAVENOUS | Status: DC
  Administered 2015-07-13: 3 mL/kg/h via INTRAVENOUS

## 2015-07-13 MED ORDER — NITROGLYCERIN 5 MG/ML IV SOLN
INTRAVENOUS | Status: AC
  Filled 2015-07-13: qty 10

## 2015-07-13 MED ORDER — FENTANYL CITRATE (PF) 100 MCG/2ML IJ SOLN
INTRAMUSCULAR | Status: AC
  Filled 2015-07-13: qty 2

## 2015-07-13 MED ORDER — BIVALIRUDIN BOLUS VIA INFUSION - CUPID
INTRAVENOUS | Status: DC | PRN
  Administered 2015-07-13: 53.1 mg via INTRAVENOUS

## 2015-07-13 SURGICAL SUPPLY — 12 items
CATH INFINITI 5FR ANG PIGTAIL (CATHETERS) ×3 IMPLANT
CATH INFINITI 5FR JL4 (CATHETERS) ×3 IMPLANT
CATH INFINITI JR4 5F (CATHETERS) IMPLANT
CATH VISTA GUIDE 6FR JR4 SH (CATHETERS) ×3 IMPLANT
DEVICE CLOSURE MYNXGRIP 6/7F (Vascular Products) ×3 IMPLANT
DEVICE INFLAT 30 PLUS (MISCELLANEOUS) ×3 IMPLANT
KIT MANI 3VAL PERCEP (MISCELLANEOUS) ×3 IMPLANT
NEEDLE PERC 18GX7CM (NEEDLE) ×3 IMPLANT
PACK CARDIAC CATH (CUSTOM PROCEDURE TRAY) ×3 IMPLANT
SHEATH AVANTI 6FR X 11CM (SHEATH) ×3 IMPLANT
WIRE EMERALD 3MM-J .035X150CM (WIRE) ×3 IMPLANT
WIRE G HI TQ BMW 190 (WIRE) IMPLANT

## 2015-07-13 NOTE — Progress Notes (Signed)
Pt. Being transferred out of ICU tomorrow.  Discussed with Dr. Stevenson Clinch.   Briefly patient is a 70 year old male past History of hypertension, previous MI, COPD with lung mass, who presented to the hospital thought to have a ST elevation MI and underwent a catheterization which did not show any evidence of coronary disease. He was admitted to the ICU on BiPAP due to COPD flare secondary to pneumonia.

## 2015-07-13 NOTE — ED Notes (Signed)
Pt here for SOB.  Arrived on bipap from [facility].

## 2015-07-13 NOTE — Progress Notes (Signed)
Pharmacy Note Vanc trough drawn at 1414 = 30 after dose given at 1235 - appears to be a peak level after one dose. Will reorder vanc trough for 4/21 at 0130 as initially entered.

## 2015-07-13 NOTE — ED Notes (Signed)
Pt taken to cath lab

## 2015-07-13 NOTE — Progress Notes (Signed)
PULMONARY / CRITICAL CARE MEDICINE   Name: Mitchell Nguyen MRN: [id number] DOB: 20-Jan-1946    ADMISSION DATE:  07/22/2015 CONSULTATION DATE:  02725  REFERRING MD:  Dr. Eddie Dibbles  CHIEF COMPLAINT:  Increased  Shortness of breath.  HISTORY OF PRESENT ILLNESS:   Mitchell Nguyen is a 70 year old male with history of CAD, Hypertension, MI, tobacco abuse, COPD, emphyysema, STEMI, lung cancer.  He was presented to the ED with  With shortness of breath on 418. His O2 sats were in 70's. Wife reported that the patient was having fever and weakness since 4/16 and it has worsen since then.  He was seen by the oncologist on 4/17.  Patient was given some fluids by the oncologist for dehydration.  Patient continues to feel short of breath and was presented to [facility] ED on 4/18 with increased shortness of breath.  Patient was placed on BiPAP and was satting in 90's.  Patient denied any chest pain nausea, vomiting, diaphoresis.  EKG was done which was concerning for infereior as well as lateral MI. Patient was immediately transferred to the cath lab and thereafter moved to the ICU on BiPAP for further management.  PAST MEDICAL HISTORY :  He  has a past medical history of Hypertension; MI (myocardial infarction) ([place]); Hyperlipidemia; Lung mass; COPD (chronic obstructive pulmonary disease) ([place]); Cough; Emphysema of lung ([place]); Shortness of breath; Constipation; Arthritis; Low back pain; ST elevation myocardial infarction (STEMI) ([place]); History of tobacco abuse; and cardiac cath.  PAST SURGICAL HISTORY: He  has past surgical history that includes Cardiac surgery; Cardiac catheterization; and Cardiac catheterization (N/A, 06/28/2015).  Allergies  Allergen Reactions  . Lisinopril Anaphylaxis    No current facility-administered medications on file prior to encounter.   Current Outpatient Prescriptions on File Prior to Encounter  Medication Sig  . albuterol (PROAIR HFA) 108 (90 Base) MCG/ACT inhaler Inhale 2 Inhalers  into the lungs every 6 (six) hours as needed. wheezing  . aspirin 81 MG tablet Take 81 mg by mouth daily. Reported on 07/09/2015  . chlorproMAZINE (THORAZINE) 10 MG tablet One table every 8 hours as needed for hiccups  . dexamethasone (DECADRON) 4 MG tablet Take one pill with breakfast/lunch  . docusate sodium (COLACE) 100 MG capsule Take 100 mg by mouth 2 (two) times daily. Reported on 06/18/2015  . fentaNYL (DURAGESIC - DOSED MCG/HR) 25 MCG/HR patch Put it on the skin; every 3 days; use it along with 18m patch [total of 728m every 3 days]  . fentaNYL (DURAGESIC - DOSED MCG/HR) 50 MCG/HR Place 1 patch (50 mcg total) onto the skin every 3 (three) days.  . Marland KitchenYDROcodone-acetaminophen (NORCO/VICODIN) 5-325 MG tablet Take 1-2 tablets by mouth every 4 (four) hours as needed for moderate pain.  . Ipratropium-Albuterol (COMBIVENT) 20-100 MCG/ACT AERS respimat Inhale 1 puff into the lungs every 6 (six) hours.  . Marland Kitchenevofloxacin (LEVAQUIN) 500 MG tablet Take 1 tablet (500 mg total) by mouth daily.  . Marland Kitchenidocaine-prilocaine (EMLA) cream Apply 1 application topically as needed. Apply to port then cover with saran wrap 1-2 hours before chemotherapy appointment  . losartan (COZAAR) 50 MG tablet Take 50 mg by mouth daily.  . magnesium hydroxide (MILK OF MAGNESIA) 400 MG/5ML suspension Take 5 mLs by mouth daily as needed for mild constipation.  . metoprolol (LOPRESSOR) 50 MG tablet Take 50 mg by mouth daily.   . NON FORMULARY Take 2 capsules by mouth daily. Reported on 06/18/2015  . ondansetron (ZOFRAN) 8 MG tablet Take 1 tablet (  8 mg total) by mouth every 8 (eight) hours as needed for nausea or vomiting (start 3 days; after chemo).  . polyethylene glycol (MIRALAX / GLYCOLAX) packet Take 17 g by mouth daily as needed for mild constipation. Reported on 07/09/2015  . predniSONE (DELTASONE) 20 MG tablet Take 3 tabs (60 mg) daily  . prochlorperazine (COMPAZINE) 10 MG tablet Take 1 tablet (10 mg total) by mouth every 6 (six)  hours as needed for nausea or vomiting.  . tiotropium (SPIRIVA) 18 MCG inhalation capsule Place 1 capsule into inhaler and inhale daily.    FAMILY HISTORY:  His has no family status information on file.   SOCIAL HISTORY: He  reports that he quit smoking about 7 months ago. His smoking use included Cigarettes. He has a 50 pack-year smoking history. He has never used smokeless tobacco. He reports that he does not drink alcohol or use illicit drugs.  REVIEW OF SYSTEMS:   Review of Systems  Constitutional: Positive for malaise/fatigue. Negative for fever, chills and diaphoresis.  HENT: Negative for ear pain and tinnitus.   Eyes: Negative for blurred vision and double vision.  Respiratory: Negative for sputum production.   Gastrointestinal: Negative for vomiting and abdominal pain.  Musculoskeletal: Negative for back pain and neck pain.  Neurological: Positive for weakness. Negative for tingling and tremors.  Psychiatric/Behavioral: Negative for hallucinations and substance abuse.     SUBJECTIVE: Elderly appearing white male was transferred from the ICU on BiPAP.  Patient states that he feels comfortable and not in any acute distress at this time. He denies any shortness of breath or chest pain at this time.  VITAL SIGNS: BP 108/75 mmHg  Pulse 98  Temp(Src) 97 F (36.1 C) (Axillary)  Wt 156 lb (70.761 kg)  SpO2 99%  HEMODYNAMICS:    VENTILATOR SETTINGS: Vent Mode:  [-]  FiO2 (%):  [30 %] 30 %  INTAKE / OUTPUT:    PHYSICAL EXAMINATION: General: Fragile , elderly appearing white male found on BiPAP in no acute distress at this time. Neuro:  Awake, alert, oriented, follows command. No focal deficit present HEENT:  Atraumatic, normocephalic,no discharge noted Cardiovascular: S1S2, RRR, no MRG noted Lungs: faint expiratory wheezes, no crackles, rhonchi, rales noted  Abdomen: firm, non tender, active bowel sounds Musculoskeletal:  No inflamation, deformity noted Skin:   Grossly intact  LABS:  BMET  Recent Labs Lab 07/09/15 0845 07/12/15 1433 07/18/2015 1122  NA 135 128* 133*  K 5.1 5.5* 6.1*  CL 107 101 105  CO2 24 23 20*  BUN 29* 46* 53*  CREATININE 0.90 1.19 1.41*  GLUCOSE 131* 139* 133*    Electrolytes  Recent Labs Lab 07/09/15 0845 07/12/15 1433 07/10/2015 1122  CALCIUM 7.3* 7.4* 7.3*    CBC  Recent Labs Lab 07/09/15 0845 07/12/15 1433 07/01/2015 1122  WBC 44.1* 10.4 2.2*  HGB 10.9* 10.0* 9.2*  HCT 32.9* 29.8* 27.9*  PLT 277 122* 89*    Coag's  Recent Labs Lab 07/08/2015 1122  APTT 35  INR 1.15    Sepsis Markers  Recent Labs Lab 07/25/2015 1122  LATICACIDVEN 1.6    ABG No results for input(s): PHART, PCO2ART, PO2ART in the last 168 hours.  Liver Enzymes  Recent Labs Lab 07/09/15 0845  AST 19  ALT 36  ALKPHOS 118  BILITOT 0.6  ALBUMIN 2.5*    Cardiac Enzymes  Recent Labs Lab 07/12/2015 1122  TROPONINI 0.11*    Glucose  Recent Labs Lab 07/06/2015 1320  GLUCAP 243*  Imaging Dg Chest 1 View  06/30/2015  CLINICAL DATA:  Shortness of breath, history lung cancer, COPD, coronary disease post MI, former smoker EXAM: CHEST 1 VIEW COMPARISON:  Portable exam 1131 hours compared to 07/12/2015 FINDINGS: RIGHT jugular Port-A-Cath with tip projecting over proximal SVC. Normal heart size mediastinal contours. Opacity at medial RIGHT lung base corresponding to the known large RIGHT middle lobe tumor on prior cross-sectional imaging studies. Severe emphysematous changes and bullous disease of the RIGHT upper lobe. Bibasilar atelectasis. No definite acute infiltrate, pleural effusion or pneumothorax. Bones demineralized. IMPRESSION: Severe COPD changes with persistent large RIGHT middle lobe mass and bibasilar atelectasis. Electronically Signed   By: Lavonia Dana M.D.   On: 07/12/2015 11:44   Dg Chest 2 View  07/03/2015  CLINICAL DATA:  Metastatic right lung cancer. Fatigue. Worsening shortness of breath with cough.  Fever. Ongoing chemotherapy. EXAM: CHEST  2 VIEW COMPARISON:  05/17/2015 FINDINGS: Mass in the right middle lobe again noted, unchanged. There is hyperinflation of the lungs compatible with COPD. No confluent airspace opacities or effusions. Right Port-A-Cath in place with the tip in the SVC. IMPRESSION: Right middle lobe mass again noted, unchanged. COPD. Electronically Signed   By: Rolm Baptise M.D.   On: 07/12/2015 08:23     STUDIES:   07/14/2015 Left heart cath indicative of Mid RCA lesion, 25% stenosed. The lesion was previously treated with a stent (unknown type).Dist LAD lesion, 15% stenosed. The lesion was previously treated with a stent (unknown type) greater than two years ago.Normal LVF EF=55%No significant CAD Widely patent stents   CULTURES: 4/18 BC>> 4/18 UC>> 4/18 [place]>> ANTIBIOTICS: 4/18 Vancomycin>> 4/18 Zosyn>>  SIGNIFICANT EVENTS: none  LINES/TUBES: none  DISCUSSION: 70 yo male with  Hypertension, hyperlipidemia, previous MI X 2, Tobaco abuse, lung mass, now presenting with STEMI and PNA. Patient had a left heat cath and was transferred to ICU on BiPAP for further management.  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 [place] chemistry  GASTROINTESTINAL A:   No active issues P:   On soft diet Protonix for GI prophylaxis  HEMATOLOGIC A:   No active issues P:  Heparin for DVT prophylaxis  INFECTIOUS A:   HCAP P:   Follow cultures Antibiotics as above Follow CBC in am  ENDOCRINE A:   No active  issues P:    blood sugar checks intermittently  NEUROLOGIC A:   No active issues P:   RASS goal:0    FAMILY  - Updates: Family was updated by Dr. Stevenson Clinch  - Inter-disciplinary family meet or Palliative Care meeting due by: 07/26/15     Ivie Savitt,AG-ACNP Pulmonary & Critical Care

## 2015-07-13 NOTE — ED Notes (Signed)
Unsuccessful at obtaining blood cultures from peripheral IV despite numerous attempts. Pt taken to cath lab before additional attempts and before antibiotics were able to be started.

## 2015-07-13 NOTE — Progress Notes (Signed)
Pharmacy Antibiotic Note  Mitchell Nguyen is a 70 y.o. male admitted on 06/27/2015 with pneumonia and sepsis.  Pharmacy has been consulted for Vancomycin and Zosyn\ dosing.  Plan:  Patient received Vancomycin 1 g IV x 1 @ ~12:30. Will start Vancomycin 1 g Iv q18 hours. Trough level prior to the 0200 dose on 4/21.   Will start Zosyn 3.375 g IV q8 hours.   Weight: 156 lb (70.761 kg)  Temp (24hrs), Avg:97.7 F (36.5 C), Min:97 F (36.1 C), Max:98.6 F (37 C)   Recent Labs Lab 07/09/15 0845 07/12/15 1433 07/09/2015 1122  WBC 44.1* 10.4 2.2*  CREATININE 0.90 1.19 1.41*  LATICACIDVEN  --   --  1.6    Estimated Creatinine Clearance: 50.9 mL/min (by C-G formula based on Cr of 1.41).    Allergies  Allergen Reactions  . Lisinopril Anaphylaxis    Antimicrobials this admission: Vancomycin 4/18>>   Zosyn 4/18  >>   Dose adjustments this admission:   Microbiology results:  BCx:   UCx:   Sputum:    MRSA PCR:   Thank you for allowing pharmacy to be a part of this patient's care.  Taneika Choi D 07/05/2015 1:27 PM

## 2015-07-13 NOTE — ED Provider Notes (Signed)
[facility] Emergency Department Provider Note  ____________________________________________  Time seen:   I have reviewed the triage vital signs and the nursing notes.   HISTORY  Chief Complaint Shortness of Breath   HPI Mitchell Nguyen is a 70 y.o. male with a history of CAD as well as lung cancer with his first chemotherapy treatment this past Friday who is presenting to the emergency department with shortness of breath. He is denying any chest pain right now. Per the medics, he was found to be in the 70s on his oxygen saturations. His wife is at the bedside and said that he started to feel ill this past Sunday with a fever and weakness. He was seen at his oncologist yesterday and given fluids for suspected dehydration and discharged home. However, his symptoms have persistently worsened since being home.Patient is not on any home oxygen. He has been off aspirin for several weeks now at the recommendation of his treating doctors. He is not on any other anticoagulation. Medics stated since the patient was started on CPAP he has been satting in the mid 90s.   Past Medical History  Diagnosis Date  . Hypertension   . MI (myocardial infarction) ([place])     x2 (2012 and 2014) at [place]  . Hyperlipidemia   . Lung mass   . COPD (chronic obstructive pulmonary disease) ([place])   . Cough   . Emphysema of lung ([place])   . Shortness of breath   . Constipation   . Arthritis   . Low back pain   . ST elevation myocardial infarction (STEMI) ([place])   . History of tobacco abuse   . Hx of cardiac cath     Patient Active Problem List   Diagnosis Date Noted  . Malignant neoplasm of lower lobe of right lung ([place]) 07/02/2015  . HLD (hyperlipidemia) 06/10/2015  . Pain from bone metastases ([place]) 06/10/2015  . Current tobacco use 06/09/2015  . BP (high blood pressure) 06/09/2015  . History of cardiac catheterization 01/13/2014    Past Surgical History  Procedure  Laterality Date  . Cardiac surgery      stents placed x2  . Cardiac catheterization    . Peripheral vascular catheterization N/A 06/28/2015    Procedure: Glori Luis Cath Insertion;  Surgeon: Algernon Huxley, MD;  Location: [facility];  Service: Cardiovascular;  Laterality: N/A;    Current Outpatient Rx  Name  Route  Sig  Dispense  Refill  . albuterol (PROAIR HFA) 108 (90 Base) MCG/ACT inhaler   Inhalation   Inhale 2 Inhalers into the lungs every 6 (six) hours as needed. wheezing         . aspirin 81 MG tablet   Oral   Take 81 mg by mouth daily. Reported on 07/09/2015         . chlorproMAZINE (THORAZINE) 10 MG tablet      One table every 8 hours as needed for hiccups   30 tablet   0   . dexamethasone (DECADRON) 4 MG tablet      Take one pill with breakfast/lunch   20 tablet   0   . docusate sodium (COLACE) 100 MG capsule   Oral   Take 100 mg by mouth 2 (two) times daily. Reported on 06/18/2015         . fentaNYL (DURAGESIC - DOSED MCG/HR) 25 MCG/HR patch      Put it on the skin; every 3 days; use it along  with 64m patch [total of 754m every 3 days]   10 patch   0   . fentaNYL (DURAGESIC - DOSED MCG/HR) 50 MCG/HR   Transdermal   Place 1 patch (50 mcg total) onto the skin every 3 (three) days.   10 patch   0   . HYDROcodone-acetaminophen (NORCO/VICODIN) 5-325 MG tablet   Oral   Take 1-2 tablets by mouth every 4 (four) hours as needed for moderate pain.   120 tablet   0   . Ipratropium-Albuterol (COMBIVENT) 20-100 MCG/ACT AERS respimat   Inhalation   Inhale 1 puff into the lungs every 6 (six) hours.   1 Inhaler   0   . levofloxacin (LEVAQUIN) 500 MG tablet   Oral   Take 1 tablet (500 mg total) by mouth daily.   7 tablet   0   . lidocaine-prilocaine (EMLA) cream   Topical   Apply 1 application topically as needed. Apply to port then cover with saran wrap 1-2 hours before chemotherapy appointment   30 g   2   . losartan (COZAAR) 50 MG tablet    Oral   Take 50 mg by mouth daily.         . magnesium hydroxide (MILK OF MAGNESIA) 400 MG/5ML suspension   Oral   Take 5 mLs by mouth daily as needed for mild constipation.         . metoprolol (LOPRESSOR) 50 MG tablet   Oral   Take 50 mg by mouth daily.          . NON FORMULARY   Oral   Take 2 capsules by mouth daily. Reported on 06/18/2015         . ondansetron (ZOFRAN) 8 MG tablet   Oral   Take 1 tablet (8 mg total) by mouth every 8 (eight) hours as needed for nausea or vomiting (start 3 days; after chemo).   40 tablet   0   . polyethylene glycol (MIRALAX / GLYCOLAX) packet   Oral   Take 17 g by mouth daily as needed for mild constipation. Reported on 07/09/2015         . predniSONE (DELTASONE) 20 MG tablet      Take 3 tabs (60 mg) daily   21 tablet   1     Take 3 tabs (60 mg) daily   . prochlorperazine (COMPAZINE) 10 MG tablet   Oral   Take 1 tablet (10 mg total) by mouth every 6 (six) hours as needed for nausea or vomiting.   40 tablet   0   . tiotropium (SPIRIVA) 18 MCG inhalation capsule   Inhalation   Place 1 capsule into inhaler and inhale daily.           Allergies Lisinopril  Family History  Problem Relation Age of Onset  . Hypertension    . Other Brother     "blood disorder"  . Heart failure Mother   . Heart disease Brother   . Heart disease Brother   . Heart disease      Social History Social History  Substance Use Topics  . Smoking status: Former Smoker -- 1.00 packs/day for 50 years    Types: Cigarettes    Quit date: 11/26/2014  . Smokeless tobacco: Never Used     Comment: "stopped 6 months ago" 2016  . Alcohol Use: No    Review of Systems Constitutional: Fever this past Sunday per wife but none since. Eyes: No visual changes.  ENT: No sore throat. Cardiovascular: Denies chest pain. Respiratory: Shortness of breath as above. Gastrointestinal: No abdominal pain.   No constipation. Genitourinary: Negative for  dysuria. Musculoskeletal: Negative for back pain. Skin: Negative for rash. Neurological: Negative for headaches, focal weakness or numbness.  10-point ROS otherwise negative.  ____________________________________________   PHYSICAL EXAM:  VITAL SIGNS: ED Triage Vitals  Enc Vitals Group     BP --      Pulse Rate 07/09/2015 1108 115     Resp --      Temp 07/10/2015 1103 97.5 F (36.4 C)     Temp Source 07/03/2015 1103 Oral     SpO2 07/05/2015 1108 100 %     Weight 07/23/2015 1131 156 lb (70.761 kg)     Height --      Head Cir --      Peak Flow --      Pain Score --      Pain Loc --      Pain Edu? --      Excl. in [place]? --     Constitutional: Alert and oriented. Wearing the CPAP that was started in the field. Eyes: Conjunctivae are normal. PERRL. EOMI. Head: Atraumatic. Nose: Wearing BiPAP mask. Mouth/Throat: wearing the BiPAP mask now. Tolerating well. Neck: No stridor.   Cardiovascular: Tachycardic, regular rhythm. Grossly normal heart sounds.   Respiratory: Tachypneic with wheezing throughout. Able to speak in full sentences. Gastrointestinal: Soft and nontender. No distention. Musculoskeletal: No lower extremity tenderness nor edema.  No joint effusions. Neurologic:  Normal speech and language. No gross focal neurologic deficits are appreciated.  Skin:  Skin is warm, dry and intact. No rash noted. Psychiatric: Mood and affect are normal. Speech and behavior are normal.  ____________________________________________   LABS (all labs ordered are listed, but only abnormal results are displayed)  Labs Reviewed  CULTURE, BLOOD (ROUTINE X 2)  CULTURE, BLOOD (ROUTINE X 2)  URINE CULTURE  CBC WITH DIFFERENTIAL/PLATELET  BASIC METABOLIC PANEL  TROPONIN I  LACTIC ACID, PLASMA  LACTIC ACID, PLASMA  URINALYSIS COMPLETEWITH MICROSCOPIC ([facility] ONLY)  APTT  PROTIME-INR  I-STAT TROPOININ, ED   ____________________________________________  EKG  ED ECG REPORT I, Doran Stabler, the attending physician, personally viewed and interpreted this ECG.   Date: 07/14/2015  EKG Time: 10:30 AM, this is the prehospital EKG  Rate: Approximately 100  Rhythm: sinus tachycardia  Axis: Normal axis  Intervals:none  ST&T Change: Possible ST elevation in aVF as well as V5 V6 but with very poor baseline. No obvious reciprocal depression on this EKG.  ED ECG REPORT I, Doran Stabler, the attending physician, personally viewed and interpreted this ECG.   Date: 07/20/2015  EKG Time: 1112  Rate: 1:15  Rhythm: sinus tachycardia  Axis: Normal axis  Intervals:none  ST&T Change: ST elevations in the inferior as well as lateral leads with reciprocal depression in aVR and V1. This EKG is markedly changed from that done in February 2017.  ED ECG REPORT I, Doran Stabler, the attending physician, personally viewed and interpreted this ECG.   Date: 07/25/2015  EKG Time: 1127  Rate: 110  Rhythm: sinus tachycardia  Axis: Normal  Intervals:none  ST&T Change: Worsened elevations as well as depressions from the previous EKG.    ____________________________________________  RADIOLOGY   IMPRESSION: Severe COPD changes with persistent large RIGHT middle lobe mass and bibasilar atelectasis.   Electronically Signed By: Lavonia Dana M.D. On: 06/26/2015 11:44 ____________________________________________  PROCEDURES  CRITICAL CARE Performed by: Doran Stabler   Total critical care time: 60mnutes  Critical care time was exclusive of separately billable procedures and treating other patients.  Critical care was necessary to treat or prevent imminent or life-threatening deterioration.  Critical care was time spent personally by me on the following activities: development of treatment plan with patient and/or surrogate as well as nursing, discussions with consultants, evaluation of patient's response to treatment, examination of patient, obtaining  history from patient or surrogate, ordering and performing treatments and interventions, ordering and review of laboratory studies, ordering and review of radiographic studies, pulse oximetry and re-evaluation of patient's condition.   ____________________________________________   INITIAL IMPRESSION / ASSESSMENT AND PLAN / ED COURSE  Pertinent labs & imaging results that were available during my care of the patient were reviewed by me and considered in my medical decision making (see chart for details).  ----------------------------------------- 12:00 PM on 07/10/2015 -----------------------------------------  Dr. CClayborn Bignesswas paged at the time of the first EKG and a STEMI alert was called. At this time his examine the patient and the patient has been consented for the catheter lab and will be dispositioned to the Cath Lab. A code sepsis was also called because of the patient's reported fever this past Sunday as well as hypotensive at this time in the emergency department. The differential for this patient includes a STEMI as well as sepsis and will be covered for both. There is also accommodating factor of the patient's first chemotherapy treatment this past Friday and a transfusion reaction also being on the list of the differential diagnosis. The patient is tolerating the BiPAP well and will receive albuterol as well as steroids. ____________________________________________   FINAL CLINICAL IMPRESSION(S) / ED DIAGNOSES  STEMI. COPD exacerbation.    DOrbie Pyo MD 07/06/[phone]

## 2015-07-13 NOTE — Telephone Encounter (Signed)
Called to report that he is not doing any better and seems to be struggling to breathe. He has ont started his Prednisone as of yet because he did not get it until late last night. He has taken 1 of his Levaquin and used his inhaler. He is very weak. Denies any further diarrhea. Chest is "rattling pretty good". I spoke with Dr Rogue Bussing who advised for him to go to the ER. Son agrees and will take him to [facility] ER

## 2015-07-14 ENCOUNTER — Inpatient Hospital Stay: Payer: 59

## 2015-07-14 DIAGNOSIS — I4891 Unspecified atrial fibrillation: Secondary | ICD-10-CM

## 2015-07-14 DIAGNOSIS — R5383 Other fatigue: Secondary | ICD-10-CM

## 2015-07-14 DIAGNOSIS — T451X5S Adverse effect of antineoplastic and immunosuppressive drugs, sequela: Secondary | ICD-10-CM

## 2015-07-14 DIAGNOSIS — E785 Hyperlipidemia, unspecified: Secondary | ICD-10-CM

## 2015-07-14 DIAGNOSIS — I214 Non-ST elevation (NSTEMI) myocardial infarction: Secondary | ICD-10-CM

## 2015-07-14 DIAGNOSIS — J449 Chronic obstructive pulmonary disease, unspecified: Secondary | ICD-10-CM

## 2015-07-14 DIAGNOSIS — D61818 Other pancytopenia: Secondary | ICD-10-CM

## 2015-07-14 DIAGNOSIS — Z87891 Personal history of nicotine dependence: Secondary | ICD-10-CM

## 2015-07-14 DIAGNOSIS — K5900 Constipation, unspecified: Secondary | ICD-10-CM

## 2015-07-14 DIAGNOSIS — M545 Low back pain: Secondary | ICD-10-CM

## 2015-07-14 DIAGNOSIS — M129 Arthropathy, unspecified: Secondary | ICD-10-CM

## 2015-07-14 DIAGNOSIS — J441 Chronic obstructive pulmonary disease with (acute) exacerbation: Secondary | ICD-10-CM

## 2015-07-14 DIAGNOSIS — R0602 Shortness of breath: Secondary | ICD-10-CM

## 2015-07-14 DIAGNOSIS — C3490 Malignant neoplasm of unspecified part of unspecified bronchus or lung: Secondary | ICD-10-CM

## 2015-07-14 DIAGNOSIS — I10 Essential (primary) hypertension: Secondary | ICD-10-CM

## 2015-07-14 DIAGNOSIS — Z66 Do not resuscitate: Secondary | ICD-10-CM

## 2015-07-14 DIAGNOSIS — D709 Neutropenia, unspecified: Secondary | ICD-10-CM

## 2015-07-14 LAB — CBC
HCT: 24.1 % — ABNORMAL LOW (ref 40.0–52.0)
HEMOGLOBIN: 8 g/dL — AB (ref 13.0–18.0)
MCH: 30.7 pg (ref 26.0–34.0)
MCHC: 33.2 g/dL (ref 32.0–36.0)
MCV: 92.7 fL (ref 80.0–100.0)
PLATELETS: 63 10*3/uL — AB (ref 150–440)
RBC: 2.6 MIL/uL — ABNORMAL LOW (ref 4.40–5.90)
RDW: 17.6 % — AB (ref 11.5–14.5)
WBC: 0.6 10*3/uL — CL (ref 3.8–10.6)

## 2015-07-14 LAB — BASIC METABOLIC PANEL
Anion gap: 10 (ref 5–15)
BUN: 57 mg/dL — AB (ref 6–20)
CALCIUM: 6.6 mg/dL — AB (ref 8.9–10.3)
CHLORIDE: 101 mmol/L (ref 101–111)
CO2: 20 mmol/L — ABNORMAL LOW (ref 22–32)
CREATININE: 1.44 mg/dL — AB (ref 0.61–1.24)
GFR calc Af Amer: 57 mL/min — ABNORMAL LOW (ref >60)
GFR calc non Af Amer: 49 mL/min — ABNORMAL LOW (ref >60)
Glucose, Bld: 153 mg/dL — ABNORMAL HIGH (ref 65–99)
Potassium: 5 mmol/L (ref 3.5–5.1)
SODIUM: 131 mmol/L — AB (ref 135–145)

## 2015-07-14 LAB — BLOOD GAS, ARTERIAL
ACID-BASE DEFICIT: 5.5 mmol/L — AB (ref 0.0–2.0)
Allens test (pass/fail): POSITIVE — AB
BICARBONATE: 18.9 meq/L — AB (ref 21.0–28.0)
FIO2: 0.4
O2 Saturation: 96.4 %
PATIENT TEMPERATURE: 37
PH ART: 7.38 (ref 7.350–7.450)
PO2 ART: 87 mmHg (ref 83.0–108.0)
pCO2 arterial: 32 mmHg (ref 32.0–48.0)

## 2015-07-14 LAB — EXPECTORATED SPUTUM ASSESSMENT W GRAM STAIN, RFLX TO RESP C

## 2015-07-14 LAB — EXPECTORATED SPUTUM ASSESSMENT W REFEX TO RESP CULTURE

## 2015-07-14 LAB — MAGNESIUM: Magnesium: 1.9 mg/dL (ref 1.7–2.4)

## 2015-07-14 LAB — PHOSPHORUS: Phosphorus: 3.9 mg/dL (ref 2.5–4.6)

## 2015-07-14 MED ORDER — DIGOXIN 0.25 MG/ML IJ SOLN
0.5000 mg | Freq: Once | INTRAMUSCULAR | Status: AC
  Administered 2015-07-14: 0.5 mg via INTRAVENOUS
  Filled 2015-07-14: qty 2

## 2015-07-14 MED ORDER — METOPROLOL TARTRATE 1 MG/ML IV SOLN
INTRAVENOUS | Status: AC
  Administered 2015-07-14: 5 mg
  Filled 2015-07-14: qty 5

## 2015-07-14 MED ORDER — MIDAZOLAM HCL 2 MG/2ML IJ SOLN
INTRAMUSCULAR | Status: AC
  Filled 2015-07-14: qty 2

## 2015-07-14 MED ORDER — AMIODARONE LOAD VIA INFUSION
150.0000 mg | Freq: Once | INTRAVENOUS | Status: AC
  Administered 2015-07-14: 150 mg via INTRAVENOUS
  Filled 2015-07-14: qty 83.34

## 2015-07-14 MED ORDER — DILTIAZEM HCL 100 MG IV SOLR
5.0000 mg/h | INTRAVENOUS | Status: DC
  Administered 2015-07-14: 5 mg/h via INTRAVENOUS
  Administered 2015-07-15: 8 mg/h via INTRAVENOUS
  Administered 2015-07-16 (×2): 5 mg/h via INTRAVENOUS
  Filled 2015-07-14 (×6): qty 100

## 2015-07-14 MED ORDER — METOPROLOL TARTRATE 1 MG/ML IV SOLN
5.0000 mg | Freq: Once | INTRAVENOUS | Status: AC
  Administered 2015-07-14: 5 mg via INTRAVENOUS

## 2015-07-14 MED ORDER — FUROSEMIDE 10 MG/ML IJ SOLN
40.0000 mg | Freq: Once | INTRAMUSCULAR | Status: AC
  Administered 2015-07-14: 40 mg via INTRAVENOUS
  Filled 2015-07-14: qty 4

## 2015-07-14 MED ORDER — DIGOXIN 0.25 MG/ML IJ SOLN
0.2500 mg | INTRAMUSCULAR | Status: AC
  Administered 2015-07-14 (×2): 0.25 mg via INTRAVENOUS
  Filled 2015-07-14 (×2): qty 2

## 2015-07-14 MED ORDER — TBO-FILGRASTIM 480 MCG/0.8ML ~~LOC~~ SOSY
480.0000 ug | PREFILLED_SYRINGE | Freq: Every day | SUBCUTANEOUS | Status: DC
  Administered 2015-07-14 – 2015-07-16 (×3): 480 ug via SUBCUTANEOUS
  Filled 2015-07-14 (×7): qty 0.8

## 2015-07-14 MED ORDER — DILTIAZEM HCL 25 MG/5ML IV SOLN
20.0000 mg | Freq: Once | INTRAVENOUS | Status: AC
  Administered 2015-07-14: 20 mg via INTRAVENOUS

## 2015-07-14 MED ORDER — DILTIAZEM LOAD VIA INFUSION
10.0000 mg | Freq: Once | INTRAVENOUS | Status: AC
  Administered 2015-07-14: 10 mg via INTRAVENOUS
  Filled 2015-07-14: qty 10

## 2015-07-14 MED ORDER — DILTIAZEM HCL 25 MG/5ML IV SOLN
INTRAVENOUS | Status: AC
  Administered 2015-07-14: 20 mg
  Filled 2015-07-14: qty 5

## 2015-07-14 MED ORDER — DILTIAZEM HCL 30 MG PO TABS
30.0000 mg | ORAL_TABLET | Freq: Four times a day (QID) | ORAL | Status: DC
  Administered 2015-07-14: 30 mg via ORAL
  Filled 2015-07-14: qty 1

## 2015-07-14 MED ORDER — DIGOXIN 0.25 MG/ML IJ SOLN
0.1250 mg | Freq: Every day | INTRAMUSCULAR | Status: DC
  Administered 2015-07-15 – 2015-07-16 (×2): 0.125 mg via INTRAVENOUS
  Filled 2015-07-14 (×2): qty 2

## 2015-07-14 MED ORDER — AMIODARONE IV BOLUS ONLY 150 MG/100ML: 150.0000 mg | Freq: Once | INTRAVENOUS | Status: DC

## 2015-07-14 NOTE — Progress Notes (Signed)
Pharmacy Antibiotic Note  Mitchell Nguyen is a 70 y.o. male admitted on 07/21/2015 with pneumonia and sepsis.  Pharmacy has been consulted for Vancomycin and Zosyn dosing.  Plan:  MRSA PCR is negative. After discussion with Dr. Stevenson Clinch, will d/c vancomycin.   Will continue Zosyn 3.375 g IV q8 hours.   Height: 6' (182.9 cm) Weight: 164 lb 14.5 oz (74.8 kg) IBW/kg (Calculated) : 77.6  Temp (24hrs), Avg:97.6 F (36.4 C), Min:97 F (36.1 C), Max:97.9 F (36.6 C)   Recent Labs Lab 07/09/15 0845 07/12/15 1433 07/20/2015 1122 07/15/2015 1414 07/14/15 0505  WBC 44.1* 10.4 2.2* 1.9* 0.6*  CREATININE 0.90 1.19 1.41* 1.35* 1.44*  LATICACIDVEN  --   --  1.6  --   --   VANCOTROUGH  --   --   --  30*  --     Estimated Creatinine Clearance: 52.7 mL/min (by C-G formula based on Cr of 1.44).    Allergies  Allergen Reactions  . Lisinopril Anaphylaxis    Antimicrobials this admission: Vancomycin 4/18>> 4/19 Zosyn 4/18  >>   Dose adjustments this admission:   Microbiology results: BCx: NGTD UCx:  NGTD Sputum:  pending MRSA PCR: NG  Thank you for allowing pharmacy to be a part of this patient's care.  Ulice Dash D 07/14/2015 12:02 PM

## 2015-07-14 NOTE — Significant Event (Signed)
AFRVR. Pt unable to take PO meds while on BiPAP - will change diltiazem to IV infusion  Merton Border, MD PCCM service Mobile [phone] Pager [phone] 07/14/2015

## 2015-07-14 NOTE — Progress Notes (Signed)
Paged prime and spoke with Dr Estanislado Pandy concerning pt's lung sounds.  Pt was very rhonchus at the beginning of my shift, but seems to be increasingly so .  Pt is not able to cough anything up at this time to help clear lungs.  Pt has had a total of 550 cc's out in his foley, but appears to have increase in work of breathing.  Per Dr. Estanislado Pandy, give a one time dose of lasix 40 mg. Will give and continue to monitor.

## 2015-07-14 NOTE — Progress Notes (Signed)
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   SUBJECTIVE:  Patient with stable respiratory status overnight, however this morning had a episode of A. fib with RVR, tachycardic heart rate into the 150s and 160s, given Cardizem, with break in tachyarrhythmia.    VITAL SIGNS: Temp:  [97.6 F (36.4 C)-97.9 F (36.6 C)] 97.6 F (36.4 C) (04/19 0800) Pulse Rate:  [98-139] 139 (04/19 1146) Resp:  [18-30] 28 (04/19 1146) BP: (104-142)/(72-123) 129/78 mmHg (04/19 0800) SpO2:  [97 %-100 %] 98 % (04/19 1146) FiO2 (%):  [40 %] 40 % (04/19 0803) Weight:  [164 lb 14.5 oz (74.8 kg)] 164 lb 14.5 oz (74.8 kg) (04/19 0500) HEMODYNAMICS:   VENTILATOR SETTINGS: Vent Mode:  [-]  FiO2 (%):  [40 %] 40 % INTAKE / OUTPUT:  Intake/Output Summary (Last 24 hours) at 07/14/15 1339 Last data filed at 07/14/15 0600  Gross per 24 hour  Intake 1519.75 ml  Output   1550 ml  Net -30.25 ml    Review of Systems  Constitutional: Positive for malaise/fatigue. Negative for fever and chills.  Respiratory: Positive for cough, shortness of breath and wheezing.   Cardiovascular: Positive for palpitations.  Gastrointestinal: Negative for heartburn.  Genitourinary: Negative for dysuria.  Musculoskeletal: Negative for myalgias.  Skin: Negative for rash.  Neurological: Positive for weakness. Negative for dizziness and headaches.  Endo/Heme/Allergies: Does not bruise/bleed easily.  Psychiatric/Behavioral: Negative for depression.    Physical Exam  Constitutional: He is oriented to person, place, and time and well-developed, well-nourished, and in no distress.  HENT:  Head:  Normocephalic.  Right Ear: External ear normal.  Left Ear: External ear normal.  Cardiovascular: Intact distal pulses.   Irregular   Pulmonary/Chest: He has wheezes.  On HFNC, faint expiratory wheezes  Abdominal: Bowel sounds are normal. He exhibits no distension.  Musculoskeletal: Normal range of motion. He exhibits no edema.  Neurological: He is alert and oriented to person, place, and time.  Skin: Skin is warm.  Nursing note and vitals reviewed.    LABS:  CBC  Recent Labs Lab 07/21/2015 1122 07/05/2015 1414 07/14/15 0505  WBC 2.2* 1.9* 0.6*  HGB 9.2* 8.7* 8.0*  HCT 27.9* 26.6* 24.1*  PLT 89* 81* 63*   Coag's  Recent Labs Lab 07/01/2015 1122 07/14/2015 1414  APTT 35 65*  INR 1.15 1.51   BMET  Recent Labs Lab 07/15/2015 1122 07/21/2015 1414 07/05/2015 2207 07/14/15 0505  NA 133* 132*  --  131*  K 6.1* 6.3* 5.6* 5.0  CL 105 104  --  101  CO2 20* 19*  --  20*  BUN 53* 54*  --  57*  CREATININE 1.41* 1.35*  --  1.44*  GLUCOSE 133* 157*  --  153*   Electrolytes  Recent Labs Lab 07/14/2015 1122 07/14/2015 1414 07/14/15 0505  CALCIUM 7.3* 7.1* 6.6*  MG  --   --  1.9  PHOS  --   --  3.9   Sepsis Markers  Recent Labs Lab 07/20/2015 1122  LATICACIDVEN 1.6   ABG  Recent Labs Lab 07/14/15 0442  PHART 7.38  PCO2ART 32  PO2ART 87   Liver Enzymes  Recent Labs Lab 07/09/15 0845  AST 19  ALT 36  ALKPHOS 118  BILITOT 0.6  ALBUMIN 2.5*   Cardiac Enzymes  Recent Labs Lab 07/07/2015 1122  TROPONINI 0.11*   Glucose  Recent Labs Lab 07/02/2015 1320  GLUCAP 243*    Imaging Dg Chest Port 1 View  07/14/2015  CLINICAL DATA:  Hypoxia/respiratory failure EXAM: PORTABLE CHEST 1 VIEW COMPARISON:  July 13, 2015 chest radiograph and PET-CT June 04, 2015 FINDINGS: Port-A-Cath tip is in the superior vena cava. No pneumothorax. There is a no new right middle lobe mass with postobstructive consolidation/pneumonitis, stable. There is a small right pleural effusion.  There is underlying bullous emphysematous change with large bullae in the upper lobes bilaterally. There is no new opacity on either side. The heart size is normal. Pulmonary vascularity reflects underlying emphysematous change. No adenopathy is demonstrable by radiography. IMPRESSION: Persistent right middle lobe mass with postobstructive consolidation. Small right effusion. Underlying emphysema. No new opacity. No change in cardiac silhouette. Note that interstitial prominence in the bases is likely due to redistribution of blood flow to viable segments of lung. Electronically Signed   By: Lowella Grip III M.D.   On: 07/14/2015 07:22    STUDIES:   07/11/2015 Left heart cath indicative of Mid RCA lesion, 25% stenosed. The lesion was previously treated with a stent (unknown type).Dist LAD lesion, 15% stenosed. The lesion was previously treated with a stent (unknown type) greater than two years ago.Normal LVF EF=55%No significant CAD Widely patent stents   CULTURES: 4/18 BC>> 4/18 UC>> 4/18 [place]>> ANTIBIOTICS: 4/18 Vancomycin>> 4/18 Zosyn>>  SIGNIFICANT EVENTS: none  LINES/TUBES: none  DISCUSSION: 70 yo male with Hypertension, hyperlipidemia, previous MI X 2, Tobaco abuse, lung mass, now presenting with STEMI and PNA. Patient had a left heat cath and was transferred to ICU on BiPAP for further management.  ASSESSMENT / PLAN:  PULMONARY A: Acute on chronic Hypoxemic respiratory failure - improving COPD PNA Lung mass - Stage IV NSCLC with brain mets P:  Continue HFNC To keep sats>88% Routine ABG CXR prn Vanc/ zosyn for HCAP Followed by oncology as an outpatient Methyl prednisone 60 mg twice and then taper Albuterol/pulmicort/atrovent Incentive spirometry, flutter valve.   CARDIOVASCULAR A:  Hx of Hypertension, hyperlipidemia New onset Afib P:  - given amiodarone bolus, started on digoxin - cards following  RENAL A:  Acute Kidney  injury Hyponatremia,Hyperkalemia  P:  Replace electrolytes. Follow chemistry  GASTROINTESTINAL A:  No active issues P:  On soft diet Protonix for GI prophylaxis  HEMATOLOGIC A:  No active issues P:  Heparin for DVT prophylaxis  INFECTIOUS A:  HCAP P:  Follow cultures Antibiotics as above Follow CBC in am  ENDOCRINE A:  No active issues P:  blood sugar checks intermittently  NEUROLOGIC A:  No active issues P:  RASS goal:0  CODE STATUS: DNR   Thank you for consulting [facility], Please feel free to contacts [place] with any questions at [phone] (please enter 7-digits).  I have personally obtained a history, examined the patient, evaluated laboratory and imaging results, formulated the assessment and plan and placed orders.  Pulmonary Care Time devoted to patient care services described in this note is 35 minutes.     Vilinda Boehringer, MD [facility] Pager [phone] (please enter 7-digits) On Call Pager [phone] (please enter 7-digits)  Note: This note was prepared with Dragon dictation along with smaller phrase technology. Any transcriptional errors that result from this  process are unintentional.

## 2015-07-14 NOTE — Consult Note (Signed)
Reason for Consult: Atrial fibrillation abnormal EKG STEMI restrictive failure Referring Physician: ER  Cardiologist Dr. Beather Arbour Mitchell Nguyen is an 70 y.o. male.  HPI: Patient is a 70 year old male with lung cancer on the right metastatic disease on chemotherapy patient presented with respiratory failure on BiPAP denies any chest pain patient had abnormal EKG. EKG was suggestive of STEMI with inferior wall ST elevation. Emergency room physician recommended emergent cardiology involvement with cardiac catheter was sent for further management and evaluation. Patient has known coronary disease history of PCI and stent 2 sees Dr. Saralyn Pilar. Patient has gotten XRT as well as chemotherapy. Patient has been on inhalers denies any fever chills or sweats not here for follow-up evaluation Patient still smoking up until September 2016. Now here for further evaluation Past Medical History  Diagnosis Date  . Hypertension   . MI (myocardial infarction) ([place])     x2 (2012 and 2014) at [place]  . Hyperlipidemia   . Lung mass   . COPD (chronic obstructive pulmonary disease) ([place])   . Cough   . Emphysema of lung ([place])   . Shortness of breath   . Constipation   . Arthritis   . Low back pain   . ST elevation myocardial infarction (STEMI) ([place])   . History of tobacco abuse   . Hx of cardiac cath     Past Surgical History  Procedure Laterality Date  . Cardiac surgery      stents placed x2  . Cardiac catheterization    . Peripheral vascular catheterization N/A 06/28/2015    Procedure: Glori Luis Cath Insertion;  Surgeon: Algernon Huxley, MD;  Location: [facility];  Service: Cardiovascular;  Laterality: N/A;  . Cardiac catheterization N/A 07/25/2015    Procedure: Left Heart Cath and Coronary Angiography;  Surgeon: Yolonda Kida, MD;  Location: [facility];  Service: Cardiovascular;  Laterality: N/A;    Family History  Problem Relation Age of Onset  . Hypertension    . Other  Brother     "blood disorder"  . Heart failure Mother   . Heart disease Brother   . Heart disease Brother   . Heart disease      Social History:  reports that he quit smoking about 7 months ago. His smoking use included Cigarettes. He has a 50 pack-year smoking history. He has never used smokeless tobacco. He reports that he does not drink alcohol or use illicit drugs.  Allergies:  Allergies  Allergen Reactions  . Lisinopril Anaphylaxis    Medications: I have reviewed the patient's current medications.  Results for orders placed or performed during the hospital encounter of 07/15/2015 (from the past 48 hour(s))  CBC with Differential     Status: Abnormal   Collection Time: 07/11/2015 11:22 AM  Result Value Ref Range   WBC 2.2 (L) 3.8 - 10.6 K/uL   RBC 3.06 (L) 4.40 - 5.90 MIL/uL   Hemoglobin 9.2 (L) 13.0 - 18.0 g/dL   HCT 27.9 (L) 40.0 - 52.0 %   MCV 91.4 80.0 - 100.0 fL   MCH 30.0 26.0 - 34.0 pg   MCHC 32.9 32.0 - 36.0 g/dL   RDW 18.5 (H) 11.5 - 14.5 %   Platelets 89 (L) 150 - 440 K/uL    Comment: RESULT REPEATED AND VERIFIED   Neutrophils Relative % 57 %   Lymphocytes Relative 5 %   Monocytes Relative 2 %   Eosinophils Relative 8 %   Basophils  Relative 0 %   Band Neutrophils 17 %   Metamyelocytes Relative 10 %   Myelocytes 1 %   Promyelocytes Absolute 0 %   Blasts 0 %   nRBC 0 0 /100 WBC   Other 0 %   Neutro Abs 1.9 1.4 - 6.5 K/uL   Lymphs Abs 0.1 (L) 1.0 - 3.6 K/uL   Monocytes Absolute 0.0 (L) 0.2 - 1.0 K/uL   Eosinophils Absolute 0.2 0 - 0.7 K/uL   Basophils Absolute 0.0 0 - 0.1 K/uL   RBC Morphology MIXED RBC POPULATION    WBC Morphology TOXIC GRANULATION   Basic metabolic panel     Status: Abnormal   Collection Time: 07/03/2015 11:22 AM  Result Value Ref Range   Sodium 133 (L) 135 - 145 mmol/L   Potassium 6.1 (H) 3.5 - 5.1 mmol/L   Chloride 105 101 - 111 mmol/L   CO2 20 (L) 22 - 32 mmol/L   Glucose, Bld 133 (H) 65 - 99 mg/dL   BUN 53 (H) 6 - 20 mg/dL    Creatinine, Ser 1.41 (H) 0.61 - 1.24 mg/dL   Calcium 7.3 (L) 8.9 - 10.3 mg/dL   GFR calc non Af Amer 50 (L) >60 mL/min   GFR calc Af Amer 58 (L) >60 mL/min    Comment: (NOTE) The eGFR has been calculated using the CKD EPI equation. This calculation has not been validated in all clinical situations. eGFR's persistently <60 mL/min signify possible Chronic Kidney Disease.    Anion gap 8 5 - 15  Troponin I     Status: Abnormal   Collection Time: 07/11/2015 11:22 AM  Result Value Ref Range   Troponin I 0.11 (H) <0.031 ng/mL    Comment: READ BACK AND VERIFIED WITH KIM CHERRY ON 06/29/2015 AT 1201 BY [facility]        PERSISTENTLY INCREASED TROPONIN VALUES IN THE RANGE OF 0.04-0.49 ng/mL CAN BE SEEN IN:       -UNSTABLE ANGINA       -CONGESTIVE HEART FAILURE       -MYOCARDITIS       -CHEST TRAUMA       -ARRYHTHMIAS       -LATE PRESENTING MYOCARDIAL INFARCTION       -COPD   CLINICAL FOLLOW-UP RECOMMENDED.   Lactic acid, plasma     Status: None   Collection Time: 07/06/2015 11:22 AM  Result Value Ref Range   Lactic Acid, Venous 1.6 0.5 - 2.0 mmol/L  APTT     Status: None   Collection Time: 07/23/2015 11:22 AM  Result Value Ref Range   aPTT 35 24 - 36 seconds  Protime-INR     Status: None   Collection Time: 07/04/2015 11:22 AM  Result Value Ref Range   Prothrombin Time 14.9 11.4 - 15.0 seconds   INR 1.15   Glucose, capillary     Status: Abnormal   Collection Time: 07/01/2015  1:20 PM  Result Value Ref Range   Glucose-Capillary 243 (H) 65 - 99 mg/dL  MRSA PCR Screening     Status: None   Collection Time: 07/18/2015  1:27 PM  Result Value Ref Range   MRSA by PCR NEGATIVE NEGATIVE    Comment:        The GeneXpert MRSA Assay (FDA approved for NASAL specimens only), is one component of a comprehensive MRSA colonization surveillance program. It is not intended to diagnose MRSA infection nor to guide or monitor treatment for MRSA infections.   Blood  Culture (routine x 2)     Status: None  (Preliminary result)   Collection Time: 06/30/2015  2:14 PM  Result Value Ref Range   Specimen Description BLOOD RIGHT HAND    Special Requests BOTTLES DRAWN AEROBIC AND ANAEROBIC  10CC     Culture NO GROWTH < 24 HOURS    Report Status PENDING   Blood Culture (routine x 2)     Status: None (Preliminary result)   Collection Time: 07/07/2015  2:14 PM  Result Value Ref Range   Specimen Description BLOOD LEFT HAND    Special Requests BOTTLES DRAWN AEROBIC AND ANAEROBIC  [place]    Culture NO GROWTH < 24 HOURS    Report Status PENDING   Vancomycin, trough     Status: Abnormal   Collection Time: 07/07/2015  2:14 PM  Result Value Ref Range   Vancomycin Tr 30 (HH) 10 - 20 ug/mL    Comment: CRITICAL RESULT CALLED TO, READ BACK BY AND VERIFIED WITH ANNA WANG AT 1646 07/03/2015 [facility]   CBC     Status: Abnormal   Collection Time: 07/24/2015  2:14 PM  Result Value Ref Range   WBC 1.9 (L) 3.8 - 10.6 K/uL   RBC 2.91 (L) 4.40 - 5.90 MIL/uL   Hemoglobin 8.7 (L) 13.0 - 18.0 g/dL   HCT 26.6 (L) 40.0 - 52.0 %   MCV 91.6 80.0 - 100.0 fL   MCH 29.8 26.0 - 34.0 pg   MCHC 32.6 32.0 - 36.0 g/dL   RDW 18.0 (H) 11.5 - 14.5 %   Platelets 81 (L) 150 - 440 K/uL  Basic metabolic panel     Status: Abnormal   Collection Time: 06/27/2015  2:14 PM  Result Value Ref Range   Sodium 132 (L) 135 - 145 mmol/L   Potassium 6.3 (H) 3.5 - 5.1 mmol/L   Chloride 104 101 - 111 mmol/L   CO2 19 (L) 22 - 32 mmol/L   Glucose, Bld 157 (H) 65 - 99 mg/dL   BUN 54 (H) 6 - 20 mg/dL   Creatinine, Ser 1.35 (H) 0.61 - 1.24 mg/dL   Calcium 7.1 (L) 8.9 - 10.3 mg/dL   GFR calc non Af Amer 53 (L) >60 mL/min   GFR calc Af Amer >60 >60 mL/min    Comment: (NOTE) The eGFR has been calculated using the CKD EPI equation. This calculation has not been validated in all clinical situations. eGFR's persistently <60 mL/min signify possible Chronic Kidney Disease.    Anion gap 9 5 - 15  APTT     Status: Abnormal   Collection Time: 07/25/2015  2:14 PM   Result Value Ref Range   aPTT 65 (H) 24 - 36 seconds    Comment:        IF BASELINE aPTT IS ELEVATED, SUGGEST PATIENT RISK ASSESSMENT BE USED TO DETERMINE APPROPRIATE ANTICOAGULANT THERAPY.   Protime-INR     Status: Abnormal   Collection Time: 07/17/2015  2:14 PM  Result Value Ref Range   Prothrombin Time 18.3 (H) 11.4 - 15.0 seconds   INR 1.51   Urinalysis complete, with microscopic ([facility] only)     Status: Abnormal   Collection Time: 06/26/2015  4:04 PM  Result Value Ref Range   Color, Urine YELLOW (A) YELLOW   APPearance HAZY (A) CLEAR   Glucose, UA NEGATIVE NEGATIVE mg/dL   Bilirubin Urine NEGATIVE NEGATIVE   Ketones, ur NEGATIVE NEGATIVE mg/dL   Specific Gravity, Urine 1.035 (H) 1.005 - 1.030  Hgb urine dipstick NEGATIVE NEGATIVE   pH 5.0 5.0 - 8.0   Protein, ur NEGATIVE NEGATIVE mg/dL   Nitrite NEGATIVE NEGATIVE   Leukocytes, UA NEGATIVE NEGATIVE   RBC / HPF NONE SEEN 0 - 5 RBC/hpf   WBC, UA 0-5 0 - 5 WBC/hpf   Bacteria, UA NONE SEEN NONE SEEN   Squamous Epithelial / LPF 0-5 (A) NONE SEEN   Mucous PRESENT    Hyaline Casts, UA PRESENT   Urine culture     Status: None (Preliminary result)   Collection Time: 07/11/2015  4:04 PM  Result Value Ref Range   Specimen Description URINE, RANDOM    Special Requests NONE    Culture NO GROWTH < 24 HOURS    Report Status PENDING   Potassium     Status: Abnormal   Collection Time: 06/28/2015 10:07 PM  Result Value Ref Range   Potassium 5.6 (H) 3.5 - 5.1 mmol/L  Blood gas, arterial     Status: Abnormal   Collection Time: 07/14/15  4:42 AM  Result Value Ref Range   FIO2 0.40    Delivery systems HI FLOW NASAL CANNULA    pH, Arterial 7.38 7.350 - 7.450   pCO2 arterial 32 32.0 - 48.0 mmHg   pO2, Arterial 87 83.0 - 108.0 mmHg   Bicarbonate 18.9 (L) 21.0 - 28.0 mEq/L   Acid-base deficit 5.5 (H) 0.0 - 2.0 mmol/L   O2 Saturation 96.4 %   Patient temperature 37.0    Collection site RIGHT RADIAL    Sample type ARTERIAL DRAW    Allens  test (pass/fail) POSITIVE (A) PASS  CBC     Status: Abnormal   Collection Time: 07/14/15  5:05 AM  Result Value Ref Range   WBC 0.6 (LL) 3.8 - 10.6 K/uL    Comment: RESULT REPEATED AND VERIFIED CRITICAL VALUE NOTED.  VALUE IS CONSISTENT WITH PREVIOUSLY REPORTED AND CALLED VALUE.    RBC 2.60 (L) 4.40 - 5.90 MIL/uL   Hemoglobin 8.0 (L) 13.0 - 18.0 g/dL   HCT 24.1 (L) 40.0 - 52.0 %   MCV 92.7 80.0 - 100.0 fL   MCH 30.7 26.0 - 34.0 pg   MCHC 33.2 32.0 - 36.0 g/dL   RDW 17.6 (H) 11.5 - 14.5 %   Platelets 63 (L) 150 - 440 K/uL  Basic metabolic panel     Status: Abnormal   Collection Time: 07/14/15  5:05 AM  Result Value Ref Range   Sodium 131 (L) 135 - 145 mmol/L   Potassium 5.0 3.5 - 5.1 mmol/L   Chloride 101 101 - 111 mmol/L   CO2 20 (L) 22 - 32 mmol/L   Glucose, Bld 153 (H) 65 - 99 mg/dL   BUN 57 (H) 6 - 20 mg/dL   Creatinine, Ser 1.44 (H) 0.61 - 1.24 mg/dL   Calcium 6.6 (L) 8.9 - 10.3 mg/dL   GFR calc non Af Amer 49 (L) >60 mL/min   GFR calc Af Amer 57 (L) >60 mL/min    Comment: (NOTE) The eGFR has been calculated using the CKD EPI equation. This calculation has not been validated in all clinical situations. eGFR's persistently <60 mL/min signify possible Chronic Kidney Disease.    Anion gap 10 5 - 15  Magnesium     Status: None   Collection Time: 07/14/15  5:05 AM  Result Value Ref Range   Magnesium 1.9 1.7 - 2.4 mg/dL  Phosphorus     Status: None   Collection Time: 07/14/15  5:05 AM  Result Value Ref Range   Phosphorus 3.9 2.5 - 4.6 mg/dL    Dg Chest 1 View  07/15/2015  CLINICAL DATA:  Shortness of breath, history lung cancer, COPD, coronary disease post MI, former smoker EXAM: CHEST 1 VIEW COMPARISON:  Portable exam 1131 hours compared to 07/12/2015 FINDINGS: RIGHT jugular Port-A-Cath with tip projecting over proximal SVC. Normal heart size mediastinal contours. Opacity at medial RIGHT lung base corresponding to the known large RIGHT middle lobe tumor on prior  cross-sectional imaging studies. Severe emphysematous changes and bullous disease of the RIGHT upper lobe. Bibasilar atelectasis. No definite acute infiltrate, pleural effusion or pneumothorax. Bones demineralized. IMPRESSION: Severe COPD changes with persistent large RIGHT middle lobe mass and bibasilar atelectasis. Electronically Signed   By: Lavonia Dana M.D.   On: 07/17/2015 11:44   Dg Chest 2 View  07/02/2015  CLINICAL DATA:  Metastatic right lung cancer. Fatigue. Worsening shortness of breath with cough. Fever. Ongoing chemotherapy. EXAM: CHEST  2 VIEW COMPARISON:  05/17/2015 FINDINGS: Mass in the right middle lobe again noted, unchanged. There is hyperinflation of the lungs compatible with COPD. No confluent airspace opacities or effusions. Right Port-A-Cath in place with the tip in the SVC. IMPRESSION: Right middle lobe mass again noted, unchanged. COPD. Electronically Signed   By: Rolm Baptise M.D.   On: 07/12/2015 08:23   Dg Chest Port 1 View  07/14/2015  CLINICAL DATA:  Hypoxia/respiratory failure EXAM: PORTABLE CHEST 1 VIEW COMPARISON:  July 13, 2015 chest radiograph and PET-CT June 04, 2015 FINDINGS: Port-A-Cath tip is in the superior vena cava. No pneumothorax. There is a no new right middle lobe mass with postobstructive consolidation/pneumonitis, stable. There is a small right pleural effusion. There is underlying bullous emphysematous change with large bullae in the upper lobes bilaterally. There is no new opacity on either side. The heart size is normal. Pulmonary vascularity reflects underlying emphysematous change. No adenopathy is demonstrable by radiography. IMPRESSION: Persistent right middle lobe mass with postobstructive consolidation. Small right effusion. Underlying emphysema. No new opacity. No change in cardiac silhouette. Note that interstitial prominence in the bases is likely due to redistribution of blood flow to viable segments of lung. Electronically Signed   By: Lowella Grip III M.D.   On: 07/14/2015 07:22    Review of Systems  Constitutional: Positive for weight loss, malaise/fatigue and diaphoresis.  HENT: Positive for congestion.   Eyes: Negative.   Respiratory: Positive for shortness of breath and wheezing.   Cardiovascular: Positive for palpitations, orthopnea, leg swelling and PND.  Gastrointestinal: Negative.   Genitourinary: Negative.   Musculoskeletal: Negative.   Skin: Negative.   Neurological: Positive for weakness.  Endo/Heme/Allergies: Negative.   Psychiatric/Behavioral: Negative.    Blood pressure 129/78, pulse 139, temperature 97.6 F (36.4 C), temperature source Axillary, resp. rate 28, height 6' (1.829 m), weight 74.8 kg (164 lb 14.5 oz), SpO2 98 %. Physical Exam  Nursing note and vitals reviewed. Constitutional: He is oriented to person, place, and time. He appears distressed.  Ill-appearing male on BiPAP  HENT:  Head: Normocephalic and atraumatic.  Right Ear: External ear normal.  Eyes: Conjunctivae and EOM are normal. Pupils are equal, round, and reactive to light.  Neck: Normal range of motion. Neck supple.  Cardiovascular: S2 normal, intact distal pulses and normal pulses.  An irregularly irregular rhythm present. Tachycardia present.   Murmur heard.  Systolic murmur is present with a grade of 2/6  Irregularly irregular heart rate tachycardiac Shortness of  breath  Respiratory: Tachypnea noted. He is in respiratory distress. He has decreased breath sounds.  GI: Soft. Bowel sounds are normal.  Musculoskeletal: Normal range of motion.  Neurological: He is alert and oriented to person, place, and time. He has normal reflexes.  Skin: Skin is warm. He is diaphoretic.  Psychiatric: He has a normal mood and affect. His behavior is normal.    Assessment/Plan: STEMI inferior wall Abnormal EKG Respiratory distress Lung cancer Chemotherapy Shortness of breath Hypoxemia Coronary artery  disease Hyperlipidemia Generalized weakness Atrial fibrillation Sepsis GERD Hemoptysis . PLAN Proceed with emergent cardiac catheter because of STEMI and abnormal EKG Continue BiPAP for respiratory support Broad spectrum antibiotic therapy for sepsis Consult oncology for lung cancer management therapy and chemotherapy Supplemental oxygen and inhalers Steroid therapy for history failure Echocardiogram may be helpful for further assessment Hyperlipidemia management with statin therapy Aggressive medical therapy ICU for sepsis was started failure Consider rate control for atrial fibrillation  CALLWOOD,DWAYNE D. 07/14/2015, 1:48 PM

## 2015-07-14 NOTE — Progress Notes (Signed)
Subjective:  Shortness of breath tachycardia palpitation no chest pain no fever chills or sweats resting comfortably in ICU  Objective:  Vital Signs in the last 24 hours: Temp:  [97.6 F (36.4 C)-97.9 F (36.6 C)] 97.6 F (36.4 C) (04/19 0800) Pulse Rate:  [102-139] 139 (04/19 1146) Resp:  [18-30] 28 (04/19 1146) BP: (105-142)/(72-123) 129/78 mmHg (04/19 0800) SpO2:  [96 %-100 %] 96 % (04/19 1354) FiO2 (%):  [30 %-40 %] 30 % (04/19 1354) Weight:  [74.8 kg (164 lb 14.5 oz)] 74.8 kg (164 lb 14.5 oz) (04/19 0500)  Intake/Output from previous day: 04/18 0701 - 04/19 0700 In: 1519.8 [P.O.:240; I.V.:929.8; IV Piggyback:350] Out: 8372 [Urine:1550] Intake/Output from this shift:    Physical Exam: General appearance: alert, cooperative and appears stated age Neck: no adenopathy, no carotid bruit, no JVD, supple, symmetrical, trachea midline and thyroid not enlarged, symmetric, no tenderness/mass/nodules Lungs: clear to auscultation bilaterally Heart: irregularly irregular rhythm Abdomen: soft, non-tender; bowel sounds normal; no masses,  no organomegaly Extremities: extremities normal, atraumatic, no cyanosis or edema Pulses: 2+ and symmetric Skin: Skin color, texture, turgor normal. No rashes or lesions Neurologic: Alert and oriented X 3, normal strength and tone. Normal symmetric reflexes. Normal coordination and gait  Lab Results:  Recent Labs  07/16/2015 1414 07/14/15 0505  WBC 1.9* 0.6*  HGB 8.7* 8.0*  PLT 81* 63*    Recent Labs  07/25/2015 1414 07/23/2015 2207 07/14/15 0505  NA 132*  --  131*  K 6.3* 5.6* 5.0  CL 104  --  101  CO2 19*  --  20*  GLUCOSE 157*  --  153*  BUN 54*  --  57*  CREATININE 1.35*  --  1.44*    Recent Labs  06/28/2015 1122  TROPONINI 0.11*   Hepatic Function Panel No results for input(s): PROT, ALBUMIN, AST, ALT, ALKPHOS, BILITOT, BILIDIR, IBILI in the last 72 hours. No results for input(s): CHOL in the last 72 hours. No results for  input(s): PROTIME in the last 72 hours.  Imaging: Imaging results have been reviewed  Cardiac Studies:  Assessment/Plan:  Arrhythmia Atrial Fibrillation Coronary Artery Disease Palpitations Shortness of Breath  Respiratory failure Weakness Lung cancer Generalized weakness and dyspnea Hypertension control Sepsis . PLAN Agree with ICU level care Continue story support with oxygen and inhalers Broad-spectrum antibiotic therapy for possible sepsis Recommend oncology input for chemotherapy DVT prophylaxis Continue blood pressure control as necessary Recommend add amiodarone for atrial fibrillation load Recommend digoxin therapy Continue diltiazem  LOS: 1 day    Mitchell Nguyen. 07/14/2015, 2:00 PM

## 2015-07-14 NOTE — Progress Notes (Signed)
Initial Nutrition Assessment  DOCUMENTATION CODES:   Severe malnutrition in context of acute illness/injury likely progressing to chronic in nature  INTERVENTION:  -Cater to pt preferences -Will send homemade milkshake BID for added kcals/protein as pt does not want nutritional supplements such as Ensure at this time; continue to assess Mitchell Nguyen is aware of pt's abdominal exam (ie distended/taut, no recent BM, nausea at times); RN plans to discuss with MD   NUTRITION DIAGNOSIS:   Malnutrition related to cancer and cancer related treatments as evidenced by percent weight loss, moderate depletion of body fat, moderate depletions of muscle mass.  GOAL:   Patient will meet greater than or equal to 90% of their needs  MONITOR:   PO intake, Supplement acceptance, Labs, Weight trends  REASON FOR ASSESSMENT:   Consult, Malnutrition Screening Tool Assessment of nutrition requirement/status  ASSESSMENT:   70 yo male admitted with SOB, malaise, weakness for past 2-3 days with STEMI s/p cath, acute respiratory distress initially requiring Bipap, pneumonia and acute COPD exacerbation, AKI with hyponatremia and hyperkalemia; pt with hx of stage IV lung cancer with multiple mets, has been receiving chemotherapy but has not been tolerating well, pt with brain mets s/p radiation  Pt on Bipap on visit today,family at bedside. Pt very SOB, history obtained mostly from family.   Past Medical History  Diagnosis Date  . Hypertension   . MI (myocardial infarction) ([place])     x2 (2012 and 2014) at [place]  . Hyperlipidemia   . Lung mass   . COPD (chronic obstructive pulmonary disease) ([place])   . Cough   . Emphysema of lung ([place])   . Shortness of breath   . Constipation   . Arthritis   . Low back pain   . ST elevation myocardial infarction (STEMI) ([place])   . History of tobacco abuse   . Hx of cardiac cath      Diet Order:  DIET SOFT Room service appropriate?: Yes; Fluid consistency::  Thin   Energy Intake: pt ate 60% at dinner last night; pt has not eaten anything today, sips of water with meds only  Food and Nutrition Related History: reports appetite has been poor, eating little and not drinking supplements. Pt had been drinking Ensure/Boost but stopped as it was giving him indigestion. Pt has lately been eating banana milkshakes and not much else  Digestive System: abdomen distended/taut, +nausea at times, , +flatus, no recent BM per pt report  Skin:  Reviewed, no issues  Labs: sodium 131 (trending down), potassium 5.0 (improved from 6.3), Creatinine 1,44, BUN 57, corrected calcium 7.8 (albumin 2.5 on 4/14, no albumin from today)  Meds: ss novolog  Nutrition-Focused physical exam completed. Findings are mild/moderate fat depletion, mild/moderate muscle depletion, and no edema.   Height:   Ht Readings from Last 1 Encounters:  06/26/2015 6' (1.829 m)    Weight: family reports 20-25 pound wt loss since Feb/March; >10% wt loss in <3 months  Wt Readings from Last 1 Encounters:  07/14/15 164 lb 14.5 oz (74.8 kg)    BMI:  Body mass index is 22.36 kg/(m^2).  Estimated Nutritional Needs:   Kcal:  2250-2625 kcals  Protein:  83-98 g   Fluid:  >2 L  EDUCATION NEEDS:   Education needs no appropriate at this time [place], [place], [place] [phone] Pager  [phone] Weekend/On-Call Pager

## 2015-07-14 NOTE — Consult Note (Signed)
[facility] CONSULT NOTE  Patient Care Team: No Pcp Per Patient as PCP - General (General Practice)  CHIEF COMPLAINTS/PURPOSE OF CONSULTATION:   HISTORY OF PRESENTING ILLNESS: History is limited for the patient as he is on a BiPAP; history is taken talking to the patient's wife/family. Mitchell Nguyen 70 y.o.  male metastatic lung cancer squamous cell currently on palliative chemotherapy carbotaxol cycle #1 day #6 is currently admitted to the hospital for worsening shortness of breath/cough. No hemoptysis. Generalized fatigue. No fevers. No nausea no vomiting.  Patient on present to the hospital had elevation in troponins/possible STEMI that led to cardiac cath that was negative. Patient is currently treated for pneumonia/COPD exacerbation- antibiotics and steroids. Patient is currently on BiPAP.  Patient's white count today was 0.6 hemoglobin 8 platelets 66. Oncology has been consult for further recommendations and evaluation.  ROS: Limited because of BiPAP.  MEDICAL HISTORY:  Past Medical History  Diagnosis Date  . Hypertension   . MI (myocardial infarction) ([facility])     x2 (2012 and 2014) at [facility]  . Hyperlipidemia   . Lung mass   . COPD (chronic obstructive pulmonary disease) ([facility])   . Cough   . Emphysema of lung ([facility])   . Shortness of breath   . Constipation   . Arthritis   . Low back pain   . ST elevation myocardial infarction (STEMI) ([facility])   . History of tobacco abuse   . Hx of cardiac cath     SURGICAL HISTORY: Past Surgical History  Procedure Laterality Date  . Cardiac surgery      stents placed x2  . Cardiac catheterization    . Peripheral vascular catheterization N/A 06/28/2015    Procedure: Glori Luis Cath Insertion;  Surgeon: Algernon Huxley, MD;  Location: [facility];  Service: Cardiovascular;  Laterality: N/A;  . Cardiac catheterization N/A 07/22/2015    Procedure: Left Heart Cath and Coronary Angiography;  Surgeon: Yolonda Kida, MD;   Location: [facility];  Service: Cardiovascular;  Laterality: N/A;    SOCIAL HISTORY: Social History   Social History  . Marital Status: Married    Spouse Name: N/A  . Number of Children: N/A  . Years of Education: N/A   Occupational History  . Not on file.   Social History Main Topics  . Smoking status: Former Smoker -- 1.00 packs/day for 50 years    Types: Cigarettes    Quit date: 11/26/2014  . Smokeless tobacco: Never Used     Comment: "stopped 6 months ago" 2016  . Alcohol Use: No  . Drug Use: No  . Sexual Activity: Not on file   Other Topics Concern  . Not on file   Social History Narrative    FAMILY HISTORY: Family History  Problem Relation Age of Onset  . Hypertension    . Other Brother     "blood disorder"  . Heart failure Mother   . Heart disease Brother   . Heart disease Brother   . Heart disease      ALLERGIES:  is allergic to lisinopril.  MEDICATIONS:  Current Facility-Administered Medications  Medication Dose Route Frequency Provider Last Rate Last Dose  . 0.9 %  sodium chloride infusion   Intravenous Continuous Orbie Pyo, MD 20 mL/hr at 07/14/2015 1157 20 mL at 07/24/2015 1157  . 0.9 %  sodium chloride infusion  250 mL Intravenous PRN Yolonda Kida, MD      .  0.9 %  sodium chloride infusion   Intravenous Continuous Holley Raring, NP   Stopped at 07/24/2015 2146  . acetaminophen (TYLENOL) tablet 650 mg  650 mg Oral Q4H PRN Dwayne D Callwood, MD      . antiseptic oral rinse (CPC / CETYLPYRIDINIUM CHLORIDE 0.05%) solution 7 mL  7 mL Mouth Rinse BID Vishal Mungal, MD   7 mL at 07/14/15 0012  . budesonide (PULMICORT) nebulizer solution 0.5 mg  0.5 mg Nebulization BID Bincy S Varughese, NP   0.5 mg at 07/14/15 0800  . [START ON 07/15/2015] digoxin (LANOXIN) 0.25 MG/ML injection 0.125 mg  0.125 mg Intravenous Daily Dwayne D Callwood, MD      . digoxin (LANOXIN) 0.25 MG/ML injection 0.25 mg  0.25 mg Intravenous Q4H Dwayne D  Callwood, MD      . famotidine (PEPCID) 40 MG/5ML suspension 20 mg  20 mg Oral BID Bincy S Varughese, NP   20 mg at 07/22/2015 2325  . ipratropium (ATROVENT) nebulizer solution 0.5 mg  0.5 mg Nebulization Q6H Bincy S Varughese, NP   0.5 mg at 07/14/15 0800  . methylPREDNISolone sodium succinate (SOLU-MEDROL) 125 mg/2 mL injection 60 mg  60 mg Intravenous Q12H Bincy S Varughese, NP   60 mg at 07/14/15 0337  . midazolam (VERSED) 2 MG/2ML injection           . ondansetron (ZOFRAN) injection 4 mg  4 mg Intravenous Q6H PRN Yolonda Kida, MD   4 mg at 07/09/2015 2111  . piperacillin-tazobactam (ZOSYN) IVPB 3.375 g  3.375 g Intravenous Q8H Orbie Pyo, MD   3.375 g at 07/14/15 0501  . sodium chloride flush (NS) 0.9 % injection 3 mL  3 mL Intravenous Q12H Dwayne D Callwood, MD   3 mL at 07/02/2015 1716  . sodium chloride flush (NS) 0.9 % injection 3 mL  3 mL Intravenous PRN Dwayne D Callwood, MD          .  PHYSICAL EXAMINATION:   Filed Vitals:   07/14/15 0800 07/14/15 1146  BP: 129/78   Pulse: 114 139  Temp: 97.6 F (36.4 C)   Resp: 30 28   Filed Weights   06/30/2015 1131 07/10/2015 1300 07/14/15 0500  Weight: 156 lb (70.761 kg) 153 lb (69.4 kg) 164 lb 14.5 oz (74.8 kg)   GENERAL: Sick -appearing Caucasian male patient resting in the bed; mild respiratory distress; BiPAP. Accompanied by multiple family members including wife sister and son.  EYES: no pallor or icterus OROPHARYNX: Cannot be examined because of BiPAP NECK: supple, no masses felt LYMPH: no palpable lymphadenopathy in the cervical, axillary or inguinal regions LUNGS: Decreased breath sounds at the right lower base; Bilateral coarse breath sounds.  HEART/CVS: Tachycardic irregularrhythm and no murmurs; No lower extremity edema ABDOMEN: abdomen soft, non-tender and normal bowel sounds Musculoskeletal:no cyanosis of digits and no clubbing  PSYCH: alert & oriented x 3 ; no focal deficits. NEURO: no focal  motor/sensory deficits SKIN: no rashes or significant lesions  LABORATORY DATA:  I have reviewed the data as listed Lab Results  Component Value Date   WBC 0.6* 07/14/2015   HGB 8.0* 07/14/2015   HCT 24.1* 07/14/2015   MCV 92.7 07/14/2015   PLT 63* 07/14/2015    Recent Labs  06/10/15 1642  07/09/15 0845  07/11/2015 1122 07/06/2015 1414 07/02/2015 2207 07/14/15 0505  NA 133*  < > 135  < > 133* 132*  --  131*  K 4.1  < >  5.1  < > 6.1* 6.3* 5.6* 5.0  CL 98*  < > 107  < > 105 104  --  101  CO2 26  < > 24  < > 20* 19*  --  20*  GLUCOSE 94  < > 131*  < > 133* 157*  --  153*  BUN 23*  < > 29*  < > 53* 54*  --  57*  CREATININE 1.08  < > 0.90  < > 1.41* 1.35*  --  1.44*  CALCIUM 9.0  < > 7.3*  < > 7.3* 7.1*  --  6.6*  GFRNONAA >60  < > >60  < > 50* 53*  --  49*  GFRAA >60  < > >60  < > 58* >60  --  57*  PROT 7.3  --  5.8*  --   --   --   --   --   ALBUMIN 3.1*  --  2.5*  --   --   --   --   --   AST 16  --  19  --   --   --   --   --   ALT 19  --  36  --   --   --   --   --   ALKPHOS 98  --  118  --   --   --   --   --   BILITOT 0.4  --  0.6  --   --   --   --   --   < > = values in this interval not displayed.  RADIOGRAPHIC STUDIES: I have personally reviewed the radiological images as listed and agreed with the findings in the report. Dg Chest 1 View  07/05/2015  CLINICAL DATA:  Shortness of breath, history lung cancer, COPD, coronary disease post MI, former smoker EXAM: CHEST 1 VIEW COMPARISON:  Portable exam 1131 hours compared to 07/12/2015 FINDINGS: RIGHT jugular Port-A-Cath with tip projecting over proximal SVC. Normal heart size mediastinal contours. Opacity at medial RIGHT lung base corresponding to the known large RIGHT middle lobe tumor on prior cross-sectional imaging studies. Severe emphysematous changes and bullous disease of the RIGHT upper lobe. Bibasilar atelectasis. No definite acute infiltrate, pleural effusion or pneumothorax. Bones demineralized. IMPRESSION: Severe  COPD changes with persistent large RIGHT middle lobe mass and bibasilar atelectasis. Electronically Signed   By: Lavonia Dana M.D.   On: 06/27/2015 11:44   Dg Chest 2 View  07/12/2015  CLINICAL DATA:  Metastatic right lung cancer. Fatigue. Worsening shortness of breath with cough. Fever. Ongoing chemotherapy. EXAM: CHEST  2 VIEW COMPARISON:  05/17/2015 FINDINGS: Mass in the right middle lobe again noted, unchanged. There is hyperinflation of the lungs compatible with COPD. No confluent airspace opacities or effusions. Right Port-A-Cath in place with the tip in the SVC. IMPRESSION: Right middle lobe mass again noted, unchanged. COPD. Electronically Signed   By: Rolm Baptise M.D.   On: 06/28/2015 08:23   Ct Biopsy  06/16/2015  INDICATION: 70 year old male with new diagnosis of lung mass and multifocal metastatic disease. He presents for CT-guided biopsy to confirm tissue diagnosis. EXAM: CT BIOPSY MEDICATIONS: None. ANESTHESIA/SEDATION: Moderate (conscious) sedation was employed during this procedure. A total of Versed 3 mg and Fentanyl 75 mcg was administered intravenously. Moderate Sedation Time: 12 minutes. The patient's level of consciousness and vital signs were monitored continuously by radiology nursing throughout the procedure under my direct supervision. FLUOROSCOPY TIME:  None COMPLICATIONS:  None immediate. PROCEDURE: Informed written consent was obtained from the patient after a thorough discussion of the procedural risks, benefits and alternatives. All questions were addressed. Maximal Sterile Barrier Technique was utilized including caps, mask, sterile gowns, sterile gloves, sterile drape, hand hygiene and skin antiseptic. A timeout was performed prior to the initiation of the procedure. A planning axial CT scan was performed. The soft tissue mass in the left paraspinal muscles at L4 is successfully identified. A suitable skin entry site was selected and marked. Local anesthesia was attained by  infiltration with 1% lidocaine. Under intermittent CT fluoroscopic guidance, a 19 gauge introducer needle was advanced into the margin of the soft tissue mass. Multiple 18 gauge core biopsies were then coaxially obtained using the bio Pince automated biopsy device. Biopsy specimens were placed in formalin and delivered to pathology for further analysis. Post biopsy axial CT imaging demonstrates no evidence of hematoma or other acute complication. IMPRESSION: Technically successful CT-guided core biopsy of left L4 paraspinal soft tissue mass. Signed, Criselda Peaches, MD [facility] Electronically Signed   By: Jacqulynn Cadet M.D.   On: 06/16/2015 14:51   Dg Chest Port 1 View  07/14/2015  CLINICAL DATA:  Hypoxia/respiratory failure EXAM: PORTABLE CHEST 1 VIEW COMPARISON:  July 13, 2015 chest radiograph and PET-CT June 04, 2015 FINDINGS: Port-A-Cath tip is in the superior vena cava. No pneumothorax. There is a no new right middle lobe mass with postobstructive consolidation/pneumonitis, stable. There is a small right pleural effusion. There is underlying bullous emphysematous change with large bullae in the upper lobes bilaterally. There is no new opacity on either side. The heart size is normal. Pulmonary vascularity reflects underlying emphysematous change. No adenopathy is demonstrable by radiography. IMPRESSION: Persistent right middle lobe mass with postobstructive consolidation. Small right effusion. Underlying emphysema. No new opacity. No change in cardiac silhouette. Note that interstitial prominence in the bases is likely due to redistribution of blood flow to viable segments of lung. Electronically Signed   By: Lowella Grip III M.D.   On: 07/14/2015 07:22    ASSESSMENT & PLAN:   70 year old male patient with history of metastatic squamous cell lung cancer currently admitted to the hospital for worsening shortness of breath.  #  Metastatic squamous cell lung cancer status post carboplatin Taxol cycle #1 day #6. Poor tolerance to chemotherapy.  # Pancytopenia/severe neutropenia white count 0.6/hemoglobin 8 platelets 63- start patient on Neupogen- for chemotherapy-induced neutropenia.  # Shortness of breath- stable chest x-ray question related to COPD exacerbation/possible pneumonia/. Unlikely Taxol-induced pneumonitis. Patient is on broad spectrum antibiotics and steroids.  # Acute renal failure creatinine 1.44- likely secondary to sepsis/dehydration; hyperkalemia improved.  # A. fib currently on Cardizem/amiodarone.  # Hypocalcemia- status post bisphosphonate 2 weeks ago. Calcium 6.6/supportive care.  # DO NOT RESUSCITATE DO NOT INTUBATE status- discussed with the patient's family. They agree. For now continue current plan of care. Overall prognosis is guarded.  Also discussed with Dr.Mungal. Thank you Dr.Patel for allowing me to participate in the care of your pleasant patient. Please do not hesitate to contact me with questions or concerns in the interim.    Cammie Sickle, MD 07/14/2015 1:29 PM

## 2015-07-14 NOTE — Progress Notes (Signed)
PT Cancellation Note  Patient Details Name: Mitchell Nguyen MRN: [id number] DOB: 10-22-1945   Cancelled Treatment:    Reason Eval/Treat Not Completed: Medical issues which prohibited therapy;Other (comment). Pt's chart reviewed and discussed pt with RN. Per RN pt is tachycardic and having difficulty breathing at this time and wishes for PT to attempt at a later time. PT will f/u and complete evaluation when pt is appropriate.   Neoma Laming, PT, DPT  07/14/2015, 9:34 AM [phone]

## 2015-07-15 ENCOUNTER — Inpatient Hospital Stay: Payer: 59

## 2015-07-15 DIAGNOSIS — C3411 Malignant neoplasm of upper lobe, right bronchus or lung: Secondary | ICD-10-CM

## 2015-07-15 DIAGNOSIS — S32049A Unspecified fracture of fourth lumbar vertebra, initial encounter for closed fracture: Secondary | ICD-10-CM

## 2015-07-15 LAB — CBC WITH DIFFERENTIAL/PLATELET
Basophils Absolute: 0 10*3/uL (ref 0–0.1)
Basophils Relative: 1 %
Eosinophils Absolute: 0 10*3/uL (ref 0–0.7)
HEMATOCRIT: 22.8 % — AB (ref 40.0–52.0)
Hemoglobin: 7.6 g/dL — ABNORMAL LOW (ref 13.0–18.0)
LYMPHS ABS: 0.1 10*3/uL — AB (ref 1.0–3.6)
Lymphocytes Relative: 19 %
MCH: 30 pg (ref 26.0–34.0)
MCHC: 33.4 g/dL (ref 32.0–36.0)
MCV: 89.7 fL (ref 80.0–100.0)
MONO ABS: 0 10*3/uL — AB (ref 0.2–1.0)
Monocytes Relative: 16 %
Neutro Abs: 0.2 10*3/uL — ABNORMAL LOW (ref 1.4–6.5)
Neutrophils Relative %: 61 %
PLATELETS: 41 10*3/uL — AB (ref 150–440)
RBC: 2.55 MIL/uL — AB (ref 4.40–5.90)
RDW: 17.7 % — ABNORMAL HIGH (ref 11.5–14.5)
WBC: 0.3 10*3/uL — AB (ref 3.8–10.6)

## 2015-07-15 LAB — URINE CULTURE: CULTURE: NO GROWTH

## 2015-07-15 LAB — BASIC METABOLIC PANEL
Anion gap: 11 (ref 5–15)
BUN: 76 mg/dL — ABNORMAL HIGH (ref 6–20)
CO2: 19 mmol/L — AB (ref 22–32)
Calcium: 6.8 mg/dL — ABNORMAL LOW (ref 8.9–10.3)
Chloride: 104 mmol/L (ref 101–111)
Creatinine, Ser: 1.58 mg/dL — ABNORMAL HIGH (ref 0.61–1.24)
GFR calc Af Amer: 51 mL/min — ABNORMAL LOW (ref >60)
GFR, EST NON AFRICAN AMERICAN: 44 mL/min — AB (ref >60)
GLUCOSE: 178 mg/dL — AB (ref 65–99)
POTASSIUM: 4.7 mmol/L (ref 3.5–5.1)
Sodium: 134 mmol/L — ABNORMAL LOW (ref 135–145)

## 2015-07-15 MED ORDER — IOPAMIDOL (ISOVUE-370) INJECTION 76%
75.0000 mL | Freq: Once | INTRAVENOUS | Status: AC | PRN
  Administered 2015-07-15: 70 mL via INTRAVENOUS

## 2015-07-15 MED ORDER — DIATRIZOATE MEGLUMINE & SODIUM 66-10 % PO SOLN: 15.0000 mL | ORAL | Status: AC

## 2015-07-15 MED ORDER — SENNOSIDES 8.8 MG/5ML PO SYRP
5.0000 mL | ORAL_SOLUTION | Freq: Two times a day (BID) | ORAL | Status: DC
  Administered 2015-07-15 – 2015-07-16 (×2): 5 mL via ORAL
  Filled 2015-07-15 (×3): qty 5

## 2015-07-15 MED ORDER — FENTANYL 75 MCG/HR TD PT72
75.0000 ug | MEDICATED_PATCH | TRANSDERMAL | Status: DC
  Administered 2015-07-15 – 2015-07-18 (×2): 75 ug via TRANSDERMAL
  Filled 2015-07-15 (×2): qty 1

## 2015-07-15 MED ORDER — OXYCODONE-ACETAMINOPHEN 5-325 MG PO TABS
1.0000 | ORAL_TABLET | Freq: Three times a day (TID) | ORAL | Status: DC
  Administered 2015-07-15 – 2015-07-16 (×4): 1 via ORAL
  Filled 2015-07-15 (×5): qty 1

## 2015-07-15 MED ORDER — DOCUSATE SODIUM 50 MG/5ML PO LIQD
100.0000 mg | Freq: Two times a day (BID) | ORAL | Status: DC
  Administered 2015-07-15 – 2015-07-16 (×2): 100 mg via ORAL
  Filled 2015-07-15 (×2): qty 10

## 2015-07-15 NOTE — Progress Notes (Signed)
Pharmacy Antibiotic Note  Mitchell Nguyen is a 70 y.o. male admitted on 07/07/2015 with pneumonia and sepsis.  Pharmacy has been consulted for Vancomycin and Zosyn dosing.  Plan:  MRSA PCR is negative. After discussion with Dr. Stevenson Clinch, will d/c vancomycin.   Will continue Zosyn 3.375 g IV q8 hours.   Height: 6' (182.9 cm) Weight: 154 lb 12.2 oz (70.2 kg) IBW/kg (Calculated) : 77.6  Temp (24hrs), Avg:98.1 F (36.7 C), Min:97.8 F (36.6 C), Max:98.6 F (37 C)   Recent Labs Lab 07/12/15 1433 07/21/2015 1122 07/04/2015 1414 07/14/15 0505 07/15/15 0844  WBC 10.4 2.2* 1.9* 0.6* 0.3*  CREATININE 1.19 1.41* 1.35* 1.44* 1.58*  LATICACIDVEN  --  1.6  --   --   --   VANCOTROUGH  --   --  30*  --   --     Estimated Creatinine Clearance: 45 mL/min (by C-G formula based on Cr of 1.58).    Allergies  Allergen Reactions  . Lisinopril Anaphylaxis    Antimicrobials this admission: Vancomycin 4/18>> 4/19 Zosyn 4/18  >>   Dose adjustments this admission:   Microbiology results: BCx: NGTD UCx:  NGTD Sputum:  pending MRSA PCR: NG  Thank you for allowing pharmacy to be a part of this patient's care.  Napoleon Form 07/15/2015 2:31 PM

## 2015-07-15 NOTE — Progress Notes (Signed)
PT Cancellation Note  Patient Details Name: Mitchell Nguyen MRN: [id number] DOB: 1945/12/02   Cancelled Treatment:    Reason Eval/Treat Not Completed: Medical issues which prohibited therapy. Pt's chart reviewed. Recently began cardizem drip for control of heart rate. PT will f/u at a later time and complete evaluation when pt is more stable and appropriate to participate in therapy.   Neoma Laming, PT, DPT  07/15/2015, 8:08 AM [phone]

## 2015-07-15 NOTE — Progress Notes (Signed)
Mitchell Nguyen   DOB:01-Jul-1945   NU#:[id number]    History is difficult to assess as patient is on BiPAP   Subjective:  No acute events overnight. Patient continues to remain On BiPAP.  Rested at night.   Objective:  Filed Vitals:   07/15/15 1500 07/15/15 1502  BP: 103/64   Pulse: 89 89  Temp:    Resp: 31 35     Intake/Output Summary (Last 24 hours) at 07/15/15 1713 Last data filed at 07/15/15 1500  Gross per 24 hour  Intake 3008.33 ml  Output   1000 ml  Net 2008.33 ml    GENERAL: Sick -appearing Caucasian male patient resting in the bed; mild respiratory distress; BiPAP. Accompanied by multiple family members including wife sister.  EYES: no pallor or icterus OROPHARYNX: Cannot be examined because of BiPAP NECK: supple, no masses felt LYMPH: no palpable lymphadenopathy in the cervical, axillary or inguinal regions LUNGS: Decreased breath sounds at the right lower base; Bilateral coarse breath sounds.  HEART/CVS: Tachycardic irregularrhythm and no murmurs; No lower extremity edema ABDOMEN: abdomen soft, non-tender and normal bowel sounds Musculoskeletal:no cyanosis of digits and no clubbing  PSYCH: alert & oriented x 3 ; no focal deficits. NEURO: no focal motor/sensory deficits SKIN: no rashes or significant lesions   Labs:  Lab Results  Component Value Date   WBC 0.3* 07/15/2015   HGB 7.6* 07/15/2015   HCT 22.8* 07/15/2015   MCV 89.7 07/15/2015   PLT 41* 07/15/2015   NEUTROABS 0.2* 07/15/2015    Lab Results  Component Value Date   NA 134* 07/15/2015   K 4.7 07/15/2015   CL 104 07/15/2015   CO2 19* 07/15/2015    Studies:  Ct Abdomen Pelvis Wo Contrast  07/15/2015  CLINICAL DATA:  Stage IV lung cancer with metastatic pain. Shortness of breath and abdominal pain EXAM: CT ANGIOGRAPHY CHEST CT ABDOMEN AND PELVIS WITHOUT CONTRAST TECHNIQUE: CT OF THE ABDOMEN WAS PERFORMED WITHOUT CONTRAST. Multidetector CT imaging of the chest was performed using the  standard protocol during bolus administration of intravenous contrast. Multiplanar CT image reconstructions and MIPs were obtained to evaluate the vascular anatomy. CONTRAST:  70 cc Isovue 370 intravenous COMPARISON:  PET-CT 06/04/2015 FINDINGS: CTA CHEST FINDINGS THORACIC INLET/BODY WALL: Porta catheter on the right with tip in good position. MEDIASTINUM: Normal heart size. Small pericardial effusion which is similar to February 2017. No superimposed nodularity. Patient's right middle lobe cancer exerts moderate mass effect on the atrium of the right lateral ventricle. Negative for acute pulmonary embolism. No acute arterial finding. Atherosclerosis with left coronary stents. LUNG WINDOWS: Severe emphysema, with bullous changes in the apical lungs. Right middle lobe is distended by a poorly enhancing 12 cm malignancy. There is airspace disease in the right lower lobe which is new from prior compatible with pneumonia. Small layering right pleural effusion OSSEOUS: Right posterior and lateral fifth rib fracture metastasis with fracture that is essentially nondisplaced. The metastasis and fracture have progressed/developed since PET CT comparison CT ABDOMEN and PELVIS FINDINGS Abdominal wall:  No contributory findings. Hepatobiliary: No focal liver abnormality.No evidence of biliary obstruction or stone. Pancreas: Unremarkable. Spleen: Unremarkable. 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 No hydronephrosis or stone. Bladder is decompressed by a well-positioned Foley catheter. Reproductive:No acute finding. Stomach/Bowel: Diffusely distended colon with proximal fluid levels no mechanical obstruction seen. No wall thickening. No small bowel obstruction. Vascular/Lymphatic: No acute vascular abnormality. No mass  or adenopathy. Peritoneal: No ascites or pneumoperitoneum. Musculoskeletal: Enlarging sacral metastasis which invades the  sacral canal and infiltrates the right S1/ S3 and bilateral S2 anterior foramina. Bulky mass centered in the left psoas and intrinsic back muscles and invading the left aspect of the L4 vertebra with body pathologic fracture. There is left L4 nerve root compromise. Review of the MIP images confirms the above findings. IMPRESSION: 1. Negative for pulmonary embolism. 2. Right lower lobe pneumonia or aspiration with small effusion. 3. Stage IV lung cancer with diffuse progression. Pathologic fractures of the L4 vertebral body and right fifth rib. 4. Colonic ileus pattern. 5. Severe, bullous emphysema. Electronically Signed   By: Monte Fantasia M.D.   On: 07/15/2015 13:16   Ct Angio Chest Pe W/cm &/or Wo Cm  07/15/2015  CLINICAL DATA:  Stage IV lung cancer with metastatic pain. Shortness of breath and abdominal pain EXAM: CT ANGIOGRAPHY CHEST CT ABDOMEN AND PELVIS WITHOUT CONTRAST TECHNIQUE: CT OF THE ABDOMEN WAS PERFORMED WITHOUT CONTRAST. Multidetector CT imaging of the chest was performed using the standard protocol during bolus administration of intravenous contrast. Multiplanar CT image reconstructions and MIPs were obtained to evaluate the vascular anatomy. CONTRAST:  70 cc Isovue 370 intravenous COMPARISON:  PET-CT 06/04/2015 FINDINGS: CTA CHEST FINDINGS THORACIC INLET/BODY WALL: Porta catheter on the right with tip in good position. MEDIASTINUM: Normal heart size. Small pericardial effusion which is similar to February 2017. No superimposed nodularity. Patient's right middle lobe cancer exerts moderate mass effect on the atrium of the right lateral ventricle. Negative for acute pulmonary embolism. No acute arterial finding. Atherosclerosis with left coronary stents. LUNG WINDOWS: Severe emphysema, with bullous changes in the apical lungs. Right middle lobe is distended by a poorly enhancing 12 cm malignancy. There is airspace disease in the right lower lobe which is new from prior compatible with pneumonia.  Small layering right pleural effusion OSSEOUS: Right posterior and lateral fifth rib fracture metastasis with fracture that is essentially nondisplaced. The metastasis and fracture have progressed/developed since PET CT comparison CT ABDOMEN and PELVIS FINDINGS Abdominal wall:  No contributory findings. Hepatobiliary: No focal liver abnormality.No evidence of biliary obstruction or stone. Pancreas: Unremarkable. Spleen: Unremarkable. 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 No hydronephrosis or stone. Bladder is decompressed by a well-positioned Foley catheter. Reproductive:No acute finding. Stomach/Bowel: Diffusely distended colon with proximal fluid levels no mechanical obstruction seen. No wall thickening. No small bowel obstruction. Vascular/Lymphatic: No acute vascular abnormality. No mass or adenopathy. Peritoneal: No ascites or pneumoperitoneum. Musculoskeletal: Enlarging sacral metastasis which invades the sacral canal and infiltrates the right S1/ S3 and bilateral S2 anterior foramina. Bulky mass centered in the left psoas and intrinsic back muscles and invading the left aspect of the L4 vertebra with body pathologic fracture. There is left L4 nerve root compromise. Review of the MIP images confirms the above findings. IMPRESSION: 1. Negative for pulmonary embolism. 2. Right lower lobe pneumonia or aspiration with small effusion. 3. Stage IV lung cancer with diffuse progression. Pathologic fractures of the L4 vertebral body and right fifth rib. 4. Colonic ileus pattern. 5. Severe, bullous emphysema. Electronically Signed   By: Monte Fantasia M.D.   On: 07/15/2015 13:16   Dg Chest Port 1 View  07/14/2015  CLINICAL DATA:  Hypoxia/respiratory failure EXAM: PORTABLE CHEST 1 VIEW COMPARISON:  July 13, 2015 chest radiograph and PET-CT June 04, 2015 FINDINGS: Port-A-Cath tip is in the superior vena  cava. No pneumothorax.  There is a no new right middle lobe mass with postobstructive consolidation/pneumonitis, stable. There is a small right pleural effusion. There is underlying bullous emphysematous change with large bullae in the upper lobes bilaterally. There is no new opacity on either side. The heart size is normal. Pulmonary vascularity reflects underlying emphysematous change. No adenopathy is demonstrable by radiography. IMPRESSION: Persistent right middle lobe mass with postobstructive consolidation. Small right effusion. Underlying emphysema. No new opacity. No change in cardiac silhouette. Note that interstitial prominence in the bases is likely due to redistribution of blood flow to viable segments of lung. Electronically Signed   By: Lowella Grip III M.D.   On: 07/14/2015 07:22    Assessment & Plan:   70 year old male patient with history of metastatic squamous cell lung cancer currently admitted to the hospital for worsening shortness of breath.  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  # Pancytopenia/severe neutropenia white count 0.3/hemoglobin 7.6 platelets 41-Neupogen D#2.  If the hemoglobin trends lower recommend transfusion.  #  Acute respiratory failure-  COPD underlying lung cancer/ Pneumonitis. . Protocol CT negative for  PE.  Continued steroids antibiotics.  # Acute renal failure creatinine 1.58- likely secondary to sepsis/dehydration.   # A. fib currently on Cardizem/amiodarone.   #  Discussed with patient family/ and Dr. Stevenson Clinch.  Guarded prognosis.   Cammie Sickle, MD 07/15/2015  5:13 PM

## 2015-07-15 NOTE — Progress Notes (Addendum)
PULMONARY / CRITICAL CARE MEDICINE   Name: Mitchell Nguyen MRN: [id number] DOB: November 13, 1945    ADMISSION DATE:  07/11/2015  BRIEF HISTORY: 70 year old male past medical history of stage IV non-small cell lung cancer with metastases to pain, currently on palliative chemotherapy, DO NOT RESUSCITATE, seen in consultation for respiratory distress after cardiac catheterization.   SUBJECTIVE:  Patient with stable respiratory status overnight, had abdominal pain.  VITAL SIGNS: Temp:  [97.8 F (36.6 C)-98.6 F (37 C)] 97.8 F (36.6 C) (04/20 1200) Pulse Rate:  [37-173] 89 (04/20 1502) Resp:  [25-38] 35 (04/20 1502) BP: (83-132)/(57-98) 116/73 mmHg (04/20 1300) SpO2:  [93 %-100 %] 99 % (04/20 1502) FiO2 (%):  [30 %] 30 % (04/20 1030) Weight:  [154 lb 12.2 oz (70.2 kg)] 154 lb 12.2 oz (70.2 kg) (04/20 0500) HEMODYNAMICS:   VENTILATOR SETTINGS: Vent Mode:  [-]  FiO2 (%):  [30 %] 30 % INTAKE / OUTPUT:  Intake/Output Summary (Last 24 hours) at 07/15/15 1533 Last data filed at 07/15/15 1314  Gross per 24 hour  Intake 2333.33 ml  Output   1000 ml  Net 1333.33 ml    Review of Systems  Constitutional: Positive for malaise/fatigue. Negative for fever and chills.  Respiratory: Positive for cough, shortness of breath and wheezing.   Cardiovascular: Positive for palpitations.  Gastrointestinal: Negative for heartburn.  Genitourinary: Negative for dysuria.  Musculoskeletal: Negative for myalgias.  Skin: Negative for rash.  Neurological: Positive for weakness. Negative for dizziness and headaches.  Endo/Heme/Allergies: Does not bruise/bleed easily.  Psychiatric/Behavioral: Negative for depression.    Physical Exam  Constitutional: He is oriented to person, place, and time and well-developed, well-nourished, and in no distress.  HENT:  Head: Normocephalic.  Right Ear: External ear normal.  Left Ear: External ear normal.  Cardiovascular: Intact distal pulses.   Irregular    Pulmonary/Chest: He has wheezes.  On HFNC, faint expiratory wheezes  Abdominal: Bowel sounds are normal. He exhibits no distension.  Musculoskeletal: Normal range of motion. He exhibits no edema.  Neurological: He is alert and oriented to person, place, and time.  Skin: Skin is warm.  Nursing note and vitals reviewed.    LABS:  CBC  Recent Labs Lab 07/02/2015 1414 07/14/15 0505 07/15/15 0844  WBC 1.9* 0.6* 0.3*  HGB 8.7* 8.0* 7.6*  HCT 26.6* 24.1* 22.8*  PLT 81* 63* 41*   Coag's  Recent Labs Lab 07/17/2015 1122 06/27/2015 1414  APTT 35 65*  INR 1.15 1.51   BMET  Recent Labs Lab 07/08/2015 1414 07/24/2015 2207 07/14/15 0505 07/15/15 0844  NA 132*  --  131* 134*  K 6.3* 5.6* 5.0 4.7  CL 104  --  101 104  CO2 19*  --  20* 19*  BUN 54*  --  57* 76*  CREATININE 1.35*  --  1.44* 1.58*  GLUCOSE 157*  --  153* 178*   Electrolytes  Recent Labs Lab 07/05/2015 1414 07/14/15 0505 07/15/15 0844  CALCIUM 7.1* 6.6* 6.8*  MG  --  1.9  --   PHOS  --  3.9  --    Sepsis Markers  Recent Labs Lab 07/20/2015 1122  LATICACIDVEN 1.6   ABG  Recent Labs Lab 07/14/15 0442  PHART 7.38  PCO2ART 32  PO2ART 87   Liver Enzymes  Recent Labs Lab 07/09/15 0845  AST 19  ALT 36  ALKPHOS 118  BILITOT 0.6  ALBUMIN 2.5*   Cardiac Enzymes  Recent Labs Lab 07/10/2015 1122  TROPONINI 0.11*  Glucose  Recent Labs Lab 06/27/2015 1320  GLUCAP 243*    Imaging Ct Abdomen Pelvis Wo Contrast  07/15/2015  CLINICAL DATA:  Stage IV lung cancer with metastatic pain. Shortness of breath and abdominal pain EXAM: CT ANGIOGRAPHY CHEST CT ABDOMEN AND PELVIS WITHOUT CONTRAST TECHNIQUE: CT OF THE ABDOMEN WAS PERFORMED WITHOUT CONTRAST. Multidetector CT imaging of the chest was performed using the standard protocol during bolus administration of intravenous contrast. Multiplanar CT image reconstructions and MIPs were obtained to evaluate the vascular anatomy. CONTRAST:  70 cc Isovue 370  intravenous COMPARISON:  PET-CT 06/04/2015 FINDINGS: CTA CHEST FINDINGS THORACIC INLET/BODY WALL: Porta catheter on the right with tip in good position. MEDIASTINUM: Normal heart size. Small pericardial effusion which is similar to February 2017. No superimposed nodularity. Patient's right middle lobe cancer exerts moderate mass effect on the atrium of the right lateral ventricle. Negative for acute pulmonary embolism. No acute arterial finding. Atherosclerosis with left coronary stents. LUNG WINDOWS: Severe emphysema, with bullous changes in the apical lungs. Right middle lobe is distended by a poorly enhancing 12 cm malignancy. There is airspace disease in the right lower lobe which is new from prior compatible with pneumonia. Small layering right pleural effusion OSSEOUS: Right posterior and lateral fifth rib fracture metastasis with fracture that is essentially nondisplaced. The metastasis and fracture have progressed/developed since PET CT comparison CT ABDOMEN and PELVIS FINDINGS Abdominal wall:  No contributory findings. Hepatobiliary: No focal liver abnormality.No evidence of biliary obstruction or stone. Pancreas: Unremarkable. Spleen: Unremarkable. 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 No hydronephrosis or stone. Bladder is decompressed by a well-positioned Foley catheter. Reproductive:No acute finding. Stomach/Bowel: Diffusely distended colon with proximal fluid levels no mechanical obstruction seen. No wall thickening. No small bowel obstruction. Vascular/Lymphatic: No acute vascular abnormality. No mass or adenopathy. Peritoneal: No ascites or pneumoperitoneum. Musculoskeletal: Enlarging sacral metastasis which invades the sacral canal and infiltrates the right S1/ S3 and bilateral S2 anterior foramina. Bulky mass centered in the left psoas and intrinsic back muscles and invading the left aspect of the L4 vertebra  with body pathologic fracture. There is left L4 nerve root compromise. Review of the MIP images confirms the above findings. IMPRESSION: 1. Negative for pulmonary embolism. 2. Right lower lobe pneumonia or aspiration with small effusion. 3. Stage IV lung cancer with diffuse progression. Pathologic fractures of the L4 vertebral body and right fifth rib. 4. Colonic ileus pattern. 5. Severe, bullous emphysema. Electronically Signed   By: Monte Fantasia M.D.   On: 07/15/2015 13:16   Ct Angio Chest Pe W/cm &/or Wo Cm  07/15/2015  CLINICAL DATA:  Stage IV lung cancer with metastatic pain. Shortness of breath and abdominal pain EXAM: CT ANGIOGRAPHY CHEST CT ABDOMEN AND PELVIS WITHOUT CONTRAST TECHNIQUE: CT OF THE ABDOMEN WAS PERFORMED WITHOUT CONTRAST. Multidetector CT imaging of the chest was performed using the standard protocol during bolus administration of intravenous contrast. Multiplanar CT image reconstructions and MIPs were obtained to evaluate the vascular anatomy. CONTRAST:  70 cc Isovue 370 intravenous COMPARISON:  PET-CT 06/04/2015 FINDINGS: CTA CHEST FINDINGS THORACIC INLET/BODY WALL: Porta catheter on the right with tip in good position. MEDIASTINUM: Normal heart size. Small pericardial effusion which is similar to February 2017. No superimposed nodularity. Patient's right middle lobe cancer exerts moderate mass effect on the atrium of the right lateral ventricle. Negative for acute pulmonary embolism. No acute arterial finding. Atherosclerosis with left coronary stents. LUNG  WINDOWS: Severe emphysema, with bullous changes in the apical lungs. Right middle lobe is distended by a poorly enhancing 12 cm malignancy. There is airspace disease in the right lower lobe which is new from prior compatible with pneumonia. Small layering right pleural effusion OSSEOUS: Right posterior and lateral fifth rib fracture metastasis with fracture that is essentially nondisplaced. The metastasis and fracture have  progressed/developed since PET CT comparison CT ABDOMEN and PELVIS FINDINGS Abdominal wall:  No contributory findings. Hepatobiliary: No focal liver abnormality.No evidence of biliary obstruction or stone. Pancreas: Unremarkable. Spleen: Unremarkable. 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 No hydronephrosis or stone. Bladder is decompressed by a well-positioned Foley catheter. Reproductive:No acute finding. Stomach/Bowel: Diffusely distended colon with proximal fluid levels no mechanical obstruction seen. No wall thickening. No small bowel obstruction. Vascular/Lymphatic: No acute vascular abnormality. No mass or adenopathy. Peritoneal: No ascites or pneumoperitoneum. Musculoskeletal: Enlarging sacral metastasis which invades the sacral canal and infiltrates the right S1/ S3 and bilateral S2 anterior foramina. Bulky mass centered in the left psoas and intrinsic back muscles and invading the left aspect of the L4 vertebra with body pathologic fracture. There is left L4 nerve root compromise. Review of the MIP images confirms the above findings. IMPRESSION: 1. Negative for pulmonary embolism. 2. Right lower lobe pneumonia or aspiration with small effusion. 3. Stage IV lung cancer with diffuse progression. Pathologic fractures of the L4 vertebral body and right fifth rib. 4. Colonic ileus pattern. 5. Severe, bullous emphysema. Electronically Signed   By: Monte Fantasia M.D.   On: 07/15/2015 13:16    STUDIES:   07/01/2015 Left heart cath indicative of Mid RCA lesion, 25% stenosed. The lesion was previously treated with a stent (unknown type).Dist LAD lesion, 15% stenosed. The lesion was previously treated with a stent (unknown type) greater than two years ago.Normal LVF EF=55%No significant CAD Widely patent stents CTA Chest 4/20 ->no pe, RLL PNA CT a/p 4/20>>colonic ileus,pathologic L4 vertebral body and right fifth rib  fx  CULTURES: 4/18 BC>> 4/18 UC>> 4/18 [place]>> ANTIBIOTICS: 4/18 Vancomycin>> 4/18 Zosyn>>  SIGNIFICANT EVENTS: none  LINES/TUBES: none  DISCUSSION: 70 yo male with Hypertension, hyperlipidemia, previous MI X 2, Tobaco abuse, lung mass, now presenting with STEMI and PNA. Patient had a left heat cath and was transferred to ICU on BiPAP for further management.  ASSESSMENT / PLAN:  PULMONARY A: Acute on chronic Hypoxemic respiratory failure - improving COPD PNA Lung mass - Stage IV NSCLC with brain mets P:  Continue HFNC To keep sats>88% Routine ABG CXR prn Vanc/ zosyn for HCAP Followed by oncology as an outpatient Methyl prednisone 60 mg twice and then taper Albuterol/pulmicort/atrovent Incentive spirometry, flutter valve.  CTA chest with no PE.   CARDIOVASCULAR A:  Hx of Hypertension, hyperlipidemia New onset Afib P:  - given amiodarone bolus, started on digoxin - cards following  RENAL A:  Acute Kidney injury Hyponatremia,Hyperkalemia - improving  P:  Replace electrolytes. Follow chemistry  GASTROINTESTINAL A:  Possible colonic ileus P:  On soft diet Protonix for GI prophylaxis BM regiment CT a/p with possible colonic ileus  HEMATOLOGIC A:  Stage IV Lung Ca with mets L4 vertebral Fx Right 5th fx P:  Heparin for DVT prophylaxis Pain management - fentanyl patch 46mg, schedule percocet for fractures (these are new)/  INFECTIOUS A:  HCAP P:  Follow cultures Antibiotics as above Follow CBC in am  ENDOCRINE A:  No active issues  P:  blood sugar checks intermittently  NEUROLOGIC A:  No active issues P:  RASS goal:0  CODE STATUS: DNR   Thank you for consulting [facility], Please feel free to contacts [place] with any questions at [phone] (please enter 7-digits).  I have personally obtained a history, examined the patient, evaluated laboratory and imaging results, formulated  the assessment and plan and placed orders.  Pulmonary Care Time devoted to patient care services described in this note is 35 minutes.     Vilinda Boehringer, MD [facility] Pager [phone] (please enter 7-digits) On Call Pager [phone] (please enter 7-digits)  Note: This note was prepared with Dragon dictation along with smaller phrase technology. Any transcriptional errors that result from this process are unintentional.

## 2015-07-16 ENCOUNTER — Inpatient Hospital Stay: Payer: 59

## 2015-07-16 DIAGNOSIS — A419 Sepsis, unspecified organism: Principal | ICD-10-CM

## 2015-07-16 DIAGNOSIS — E43 Unspecified severe protein-calorie malnutrition: Secondary | ICD-10-CM

## 2015-07-16 DIAGNOSIS — N179 Acute kidney failure, unspecified: Secondary | ICD-10-CM

## 2015-07-16 DIAGNOSIS — Z515 Encounter for palliative care: Secondary | ICD-10-CM

## 2015-07-16 LAB — CBC WITH DIFFERENTIAL/PLATELET
BASOS ABS: 0 10*3/uL (ref 0–0.1)
Basophils Relative: 0 %
Eosinophils Absolute: 0 10*3/uL (ref 0–0.7)
HEMATOCRIT: 23.4 % — AB (ref 40.0–52.0)
Hemoglobin: 7.7 g/dL — ABNORMAL LOW (ref 13.0–18.0)
LYMPHS ABS: 0.1 10*3/uL — AB (ref 1.0–3.6)
MCH: 30.5 pg (ref 26.0–34.0)
MCHC: 32.7 g/dL (ref 32.0–36.0)
MCV: 93 fL (ref 80.0–100.0)
MONO ABS: 0.1 10*3/uL — AB (ref 0.2–1.0)
Neutro Abs: 0.2 10*3/uL — ABNORMAL LOW (ref 1.4–6.5)
Neutrophils Relative %: 38 %
PLATELETS: 35 10*3/uL — AB (ref 150–440)
RBC: 2.51 MIL/uL — ABNORMAL LOW (ref 4.40–5.90)
RDW: 17.4 % — AB (ref 11.5–14.5)
WBC: 0.4 10*3/uL — CL (ref 3.8–10.6)

## 2015-07-16 LAB — HEMOGLOBIN AND HEMATOCRIT, BLOOD
HEMATOCRIT: 30.4 % — AB (ref 40.0–52.0)
Hemoglobin: 10.3 g/dL — ABNORMAL LOW (ref 13.0–18.0)

## 2015-07-16 LAB — BASIC METABOLIC PANEL
Anion gap: 14 (ref 5–15)
BUN: 94 mg/dL — AB (ref 6–20)
CALCIUM: 6.7 mg/dL — AB (ref 8.9–10.3)
CO2: 18 mmol/L — AB (ref 22–32)
CREATININE: 2.31 mg/dL — AB (ref 0.61–1.24)
Chloride: 106 mmol/L (ref 101–111)
GFR calc Af Amer: 32 mL/min — ABNORMAL LOW (ref >60)
GFR, EST NON AFRICAN AMERICAN: 28 mL/min — AB (ref >60)
GLUCOSE: 189 mg/dL — AB (ref 65–99)
Potassium: 5.2 mmol/L — ABNORMAL HIGH (ref 3.5–5.1)
Sodium: 138 mmol/L (ref 135–145)

## 2015-07-16 LAB — ABO/RH: ABO/RH(D): B POS

## 2015-07-16 LAB — PREPARE RBC (CROSSMATCH)

## 2015-07-16 MED ORDER — BISACODYL 10 MG RE SUPP
10.0000 mg | Freq: Every day | RECTAL | Status: DC | PRN
  Administered 2015-07-16: 10 mg via RECTAL
  Filled 2015-07-16: qty 1

## 2015-07-16 MED ORDER — LORAZEPAM 2 MG/ML IJ SOLN
0.5000 mg | INTRAMUSCULAR | Status: DC | PRN
  Administered 2015-07-17 – 2015-07-18 (×2): 0.5 mg via INTRAVENOUS
  Filled 2015-07-16 (×3): qty 1

## 2015-07-16 MED ORDER — ACETAMINOPHEN 325 MG PO TABS
650.0000 mg | ORAL_TABLET | Freq: Once | ORAL | Status: AC
Start: 2015-07-16 — End: 2015-07-16
  Administered 2015-07-16: 650 mg via ORAL
  Filled 2015-07-16: qty 2

## 2015-07-16 MED ORDER — PROCHLORPERAZINE 25 MG RE SUPP
25.0000 mg | Freq: Three times a day (TID) | RECTAL | Status: DC | PRN
  Filled 2015-07-16: qty 1

## 2015-07-16 MED ORDER — FUROSEMIDE 10 MG/ML IJ SOLN
20.0000 mg | Freq: Once | INTRAMUSCULAR | Status: AC
Start: 2015-07-16 — End: 2015-07-16
  Administered 2015-07-16: 20 mg via INTRAVENOUS
  Filled 2015-07-16: qty 2

## 2015-07-16 MED ORDER — HEPARIN SOD (PORK) LOCK FLUSH 100 UNIT/ML IV SOLN
500.0000 [IU] | Freq: Every day | INTRAVENOUS | Status: DC | PRN
  Filled 2015-07-16: qty 5

## 2015-07-16 MED ORDER — CETYLPYRIDINIUM CHLORIDE 0.05 % MT LIQD
7.0000 mL | Freq: Two times a day (BID) | OROMUCOSAL | Status: DC
  Administered 2015-07-16 – 2015-07-17 (×2): 7 mL via OROMUCOSAL

## 2015-07-16 MED ORDER — SODIUM CHLORIDE 0.9 % IV SOLN
250.0000 mL | Freq: Once | INTRAVENOUS | Status: AC
  Administered 2015-07-16: 250 mL via INTRAVENOUS

## 2015-07-16 MED ORDER — GUAIFENESIN ER 600 MG PO TB12
600.0000 mg | ORAL_TABLET | Freq: Two times a day (BID) | ORAL | Status: DC
  Administered 2015-07-16: 600 mg via ORAL
  Filled 2015-07-16: qty 1

## 2015-07-16 MED ORDER — MORPHINE SULFATE (PF) 4 MG/ML IV SOLN
4.0000 mg | INTRAVENOUS | Status: DC | PRN
  Administered 2015-07-17 – 2015-07-19 (×16): 4 mg via INTRAVENOUS
  Filled 2015-07-16 (×16): qty 1

## 2015-07-16 MED ORDER — GUAIFENESIN-DM 100-10 MG/5ML PO SYRP: 10.0000 mL | ORAL_SOLUTION | Freq: Four times a day (QID) | ORAL | Status: DC | PRN

## 2015-07-16 MED ORDER — MORPHINE SULFATE (PF) 2 MG/ML IV SOLN
2.0000 mg | Freq: Once | INTRAVENOUS | Status: AC
  Administered 2015-07-16: 2 mg via INTRAVENOUS
  Filled 2015-07-16: qty 1

## 2015-07-16 MED ORDER — FAMOTIDINE 40 MG/5ML PO SUSR: 20.0000 mg | ORAL | Status: DC

## 2015-07-16 MED ORDER — SODIUM CHLORIDE 0.9 % IV SOLN: INTRAVENOUS | Status: DC

## 2015-07-16 MED ORDER — HEPARIN SOD (PORK) LOCK FLUSH 100 UNIT/ML IV SOLN
250.0000 [IU] | INTRAVENOUS | Status: DC | PRN
  Filled 2015-07-16: qty 3

## 2015-07-16 MED ORDER — PIPERACILLIN-TAZOBACTAM 3.375 G IVPB
3.3750 g | Freq: Two times a day (BID) | INTRAVENOUS | Status: DC
  Administered 2015-07-16 – 2015-07-17 (×2): 3.375 g via INTRAVENOUS
  Filled 2015-07-16 (×3): qty 50

## 2015-07-16 MED ORDER — MORPHINE SULFATE (PF) 2 MG/ML IV SOLN
2.0000 mg | INTRAVENOUS | Status: DC | PRN
  Administered 2015-07-17 – 2015-07-18 (×2): 2 mg via INTRAVENOUS
  Filled 2015-07-16 (×4): qty 1

## 2015-07-16 MED ORDER — DIPHENHYDRAMINE HCL 25 MG PO CAPS
25.0000 mg | ORAL_CAPSULE | Freq: Once | ORAL | Status: AC
  Administered 2015-07-16: 25 mg via ORAL
  Filled 2015-07-16: qty 1

## 2015-07-16 MED ORDER — GUAIFENESIN 100 MG/5ML PO SOLN
5.0000 mL | ORAL | Status: DC | PRN
  Filled 2015-07-16: qty 5

## 2015-07-16 MED ORDER — SODIUM CHLORIDE 0.9% FLUSH: 10.0000 mL | INTRAVENOUS | Status: DC | PRN

## 2015-07-16 MED ORDER — MORPHINE SULFATE (PF) 2 MG/ML IV SOLN
2.0000 mg | Freq: Four times a day (QID) | INTRAVENOUS | Status: DC
Start: 2015-07-16 — End: 2015-07-19
  Administered 2015-07-16 – 2015-07-19 (×12): 2 mg via INTRAVENOUS
  Filled 2015-07-16 (×11): qty 1

## 2015-07-16 MED ORDER — SODIUM CHLORIDE 0.9% FLUSH: 3.0000 mL | INTRAVENOUS | Status: DC | PRN

## 2015-07-16 NOTE — Progress Notes (Signed)
Mitchell Nguyen is alert. Voice hoarse. Foley in place. Only urine output of 116m from foley. Dr. KJuleen [place]. No new ordered. NG placed, with a total of 300 ml of light green secretions. NG on LIS. Family at bedside. 2 units of PRB's given. Lasix given between infusions. VSS

## 2015-07-16 NOTE — Progress Notes (Signed)
[facility] [place] NAME: Mitchell Nguyen    MR#:  [id number]  DATE OF BIRTH:  1945/04/08  SUBJECTIVE:  CHIEF COMPLAINT:   Chief Complaint  Patient presents with  . 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 - Remained on BiPAP, now on high flow at 36% fio2 and 45% flow rate. - very congested cough, worsening renal function, still leukopenic- receiving 2 units prbc today, received neupogen - on cardizem drip for afib,  - abdominal distention present.  REVIEW OF SYSTEMS:  Review of Systems  Constitutional: Positive for malaise/fatigue. Negative for fever and chills.  HENT: Negative for congestion, ear discharge, ear pain and nosebleeds.   Eyes: Negative for blurred vision and double vision.  Respiratory: Positive for cough and sputum production. Negative for shortness of breath and wheezing.   Cardiovascular: Negative for chest pain, palpitations and leg swelling.  Gastrointestinal: Positive for nausea, abdominal pain and constipation. Negative for heartburn, vomiting and diarrhea.  Genitourinary: Negative for dysuria.  Musculoskeletal: Positive for myalgias and back pain.  Neurological: Negative for dizziness, tingling, tremors, seizures and headaches.  Psychiatric/Behavioral: Negative for depression.    DRUG ALLERGIES:   Allergies  Allergen Reactions  . Lisinopril Anaphylaxis    VITALS:  Blood pressure 118/80, pulse 94, temperature 97 F (36.1 C), temperature source Axillary, resp. rate 21, height 6' (1.829 m), weight 69.3 kg (152 lb 12.5 oz), SpO2 94 %.  PHYSICAL EXAMINATION:  Physical Exam  GENERAL:  70 y.o.-year-old critically ill patient lying in the bed with no acute distress.  EYES: Pupils equal, round, reactive to light and accommodation. No  scleral icterus. Extraocular muscles intact.  HEENT: Head atraumatic, normocephalic. Oropharynx and nasopharynx clear.  NECK:  Supple, no jugular venous distention. No thyroid enlargement, no tenderness.  LUNGS: coarse breath sounds bilaterally, no wheezing, rales or crepitation. No use of accessory muscles of respiration.  CARDIOVASCULAR: S1, S2 regular now, No murmurs, rubs, or gallops.  ABDOMEN: firm and distended, hypoactive bowel sounds on exam.  EXTREMITIES: No pedal edema, cyanosis, or clubbing.  NEUROLOGIC: Cranial nerves II through XII are intact. Muscle strength 5/5 in all extremities. Sensation intact. Gait not checked. Global weakness noted PSYCHIATRIC: The patient is alert and oriented x 3.  SKIN: No obvious rash, lesion, or ulcer.    LABORATORY PANEL:   CBC  Recent Labs Lab 07/16/15 0529  WBC 0.4*  HGB 7.7*  HCT 23.4*  PLT 35*   ------------------------------------------------------------------------------------------------------------------  Chemistries   Recent Labs Lab 07/14/15 0505  07/16/15 0529  NA 131*  < > 138  K 5.0  < > 5.2*  CL 101  < > 106  CO2 20*  < > 18*  GLUCOSE 153*  < > 189*  BUN 57*  < > 94*  CREATININE 1.44*  < > 2.31*  CALCIUM 6.6*  < > 6.7*  MG 1.9  --   --   < > = values in this interval not displayed. ------------------------------------------------------------------------------------------------------------------  Cardiac Enzymes  Recent Labs Lab 07/25/2015 1122  TROPONINI 0.11*   ------------------------------------------------------------------------------------------------------------------  RADIOLOGY:  Ct Abdomen Pelvis Wo Contrast  07/15/2015  CLINICAL DATA:  Stage IV lung cancer with metastatic pain. Shortness of breath and abdominal pain EXAM: CT ANGIOGRAPHY CHEST CT ABDOMEN AND PELVIS WITHOUT CONTRAST TECHNIQUE: CT OF THE ABDOMEN WAS  PERFORMED WITHOUT CONTRAST. Multidetector CT imaging of the chest was performed using  the standard protocol during bolus administration of intravenous contrast. Multiplanar CT image reconstructions and MIPs were obtained to evaluate the vascular anatomy. CONTRAST:  70 cc Isovue 370 intravenous COMPARISON:  PET-CT 06/04/2015 FINDINGS: CTA CHEST FINDINGS THORACIC INLET/BODY WALL: Porta catheter on the right with tip in good position. MEDIASTINUM: Normal heart size. Small pericardial effusion which is similar to February 2017. No superimposed nodularity. Patient's right middle lobe cancer exerts moderate mass effect on the atrium of the right lateral ventricle. Negative for acute pulmonary embolism. No acute arterial finding. Atherosclerosis with left coronary stents. LUNG WINDOWS: Severe emphysema, with bullous changes in the apical lungs. Right middle lobe is distended by a poorly enhancing 12 cm malignancy. There is airspace disease in the right lower lobe which is new from prior compatible with pneumonia. Small layering right pleural effusion OSSEOUS: Right posterior and lateral fifth rib fracture metastasis with fracture that is essentially nondisplaced. The metastasis and fracture have progressed/developed since PET CT comparison CT ABDOMEN and PELVIS FINDINGS Abdominal wall:  No contributory findings. Hepatobiliary: No focal liver abnormality.No evidence of biliary obstruction or stone. Pancreas: Unremarkable. Spleen: Unremarkable. 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 No hydronephrosis or stone. Bladder is decompressed by a well-positioned Foley catheter. Reproductive:No acute finding. Stomach/Bowel: Diffusely distended colon with proximal fluid levels no mechanical obstruction seen. No wall thickening. No small bowel obstruction. Vascular/Lymphatic: No acute vascular abnormality. No mass or adenopathy. Peritoneal: No ascites or pneumoperitoneum. Musculoskeletal: Enlarging sacral metastasis which invades  the sacral canal and infiltrates the right S1/ S3 and bilateral S2 anterior foramina. Bulky mass centered in the left psoas and intrinsic back muscles and invading the left aspect of the L4 vertebra with body pathologic fracture. There is left L4 nerve root compromise. Review of the MIP images confirms the above findings. IMPRESSION: 1. Negative for pulmonary embolism. 2. Right lower lobe pneumonia or aspiration with small effusion. 3. Stage IV lung cancer with diffuse progression. Pathologic fractures of the L4 vertebral body and right fifth rib. 4. Colonic ileus pattern. 5. Severe, bullous emphysema. Electronically Signed   By: Monte Fantasia M.D.   On: 07/15/2015 13:16   Ct Angio Chest Pe W/cm &/or Wo Cm  07/15/2015  CLINICAL DATA:  Stage IV lung cancer with metastatic pain. Shortness of breath and abdominal pain EXAM: CT ANGIOGRAPHY CHEST CT ABDOMEN AND PELVIS WITHOUT CONTRAST TECHNIQUE: CT OF THE ABDOMEN WAS PERFORMED WITHOUT CONTRAST. Multidetector CT imaging of the chest was performed using the standard protocol during bolus administration of intravenous contrast. Multiplanar CT image reconstructions and MIPs were obtained to evaluate the vascular anatomy. CONTRAST:  70 cc Isovue 370 intravenous COMPARISON:  PET-CT 06/04/2015 FINDINGS: CTA CHEST FINDINGS THORACIC INLET/BODY WALL: Porta catheter on the right with tip in good position. MEDIASTINUM: Normal heart size. Small pericardial effusion which is similar to February 2017. No superimposed nodularity. Patient's right middle lobe cancer exerts moderate mass effect on the atrium of the right lateral ventricle. Negative for acute pulmonary embolism. No acute arterial finding. Atherosclerosis with left coronary stents. LUNG WINDOWS: Severe emphysema, with bullous changes in the apical lungs. Right middle lobe is distended by a poorly enhancing 12 cm malignancy. There is airspace disease in the right lower lobe which is new from prior compatible with  pneumonia. Small layering right pleural effusion OSSEOUS: Right posterior and lateral fifth rib fracture metastasis  with fracture that is essentially nondisplaced. The metastasis and fracture have progressed/developed since PET CT comparison CT ABDOMEN and PELVIS FINDINGS Abdominal wall:  No contributory findings. Hepatobiliary: No focal liver abnormality.No evidence of biliary obstruction or stone. Pancreas: Unremarkable. Spleen: Unremarkable. 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 No hydronephrosis or stone. Bladder is decompressed by a well-positioned Foley catheter. Reproductive:No acute finding. Stomach/Bowel: Diffusely distended colon with proximal fluid levels no mechanical obstruction seen. No wall thickening. No small bowel obstruction. Vascular/Lymphatic: No acute vascular abnormality. No mass or adenopathy. Peritoneal: No ascites or pneumoperitoneum. Musculoskeletal: Enlarging sacral metastasis which invades the sacral canal and infiltrates the right S1/ S3 and bilateral S2 anterior foramina. Bulky mass centered in the left psoas and intrinsic back muscles and invading the left aspect of the L4 vertebra with body pathologic fracture. There is left L4 nerve root compromise. Review of the MIP images confirms the above findings. IMPRESSION: 1. Negative for pulmonary embolism. 2. Right lower lobe pneumonia or aspiration with small effusion. 3. Stage IV lung cancer with diffuse progression. Pathologic fractures of the L4 vertebral body and right fifth rib. 4. Colonic ileus pattern. 5. Severe, bullous emphysema. Electronically Signed   By: Monte Fantasia M.D.   On: 07/15/2015 13:16    EKG:   Orders placed or performed in visit on 07/25/2015  . EKG 12-Lead  . EKG 12-Lead  . 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  #1 Acute hypoxic respiratory failure- secondary to pneumonitis and also healthcare acquired pneumonia-requiring BiPAP at bedtime. Now changed over to high flow nasal cannula. -Appreciate pulmonary consult. -Continue steroids and taper - On zosyn -Encouraged flutter valve and if needed we can do percussion chest therapy -CT chest negative for any pulmonary embolism -Continue inhalers.  #2 Afib with rvr- appreciate cardiology consult - currently on cardizem drip -Loaded with digoxin and on daily digoxin now -On admission due to EKG changes and also elevated troponin, STEMI was suspected and patient underwent cardiac catheterization that did not reveal worsening active disease  #3 Pancytopenia-continue to recent chemotherapy. Received Neupogen. White count is still low at 0.4. - Hemoglobin dropped steadily decreased to 7.7, receiving 2 units packed RBC transfusion today -Platelets also at 35K, no transfusion indicated unless less than 20 K or any active bleeding suspected -Appreciate oncology consult  #4 Metastatic squamous cell cell lung cancer-lung cancer with spread to bones and also small brain metastases noted. -Received first cycle of carboplatin and Taxol - complications with pancytopenia - received radiation for his back mets - guarded prognosis at this point. Patient is DNR - Appreciate oncology consult  #5 ARF- ? ATN  - renal US, monitor urine output - nephrology consult if not improving by tomorrow  #6 Abdominal distention- ? Ileus on CT abd - laxatives started. monitor  #7 DVT Prophylaxis- TEDs and SCDs. No heparin products due to anemia and thrombocytopenia.     All the records are reviewed and case discussed with Care Management/Social Workerr. Management plans discussed with the patient, family and they are in agreement.  CODE STATUS: DNR  TOTAL CRITICAL CARE TIME SPENT  IN TAKING  CARE OF THIS PATIENT: 37 minutes.   Guarded prognosis overall. Discussed with Dr. Stevenson Clinch, Dr. Rogue Bussing and also  patients wife and brother at bedside.   Gladstone Lighter M.D on 07/16/2015 at 11:40 AM  Between 7am to 6pm - Pager - [phone]  After 6pm go to [email or web address] - password [email or web address] [facility]  [facility]  Office  [phone]  CC: Primary care physician; No PCP Per Patient

## 2015-07-16 NOTE — Consult Note (Signed)
New referral for hospice at home upon discharge from [facility].  Mitchell Nguyen is a 70yo gentleman who was admitted to CCU on 4.18.17 with increased SOB pneumonia and STEMI.  He is post cardiac cath which was negative.  Patient has past medical history of stage IV squamous cell lung cancer with bone mets, COPD, CAD (stents), MI x2, hyperlipidemia, HTN, Emphysema, arthritis, and tobacco use.  He is currently in ICU on cardizem gtt for afib,  IV zosyn and IV steroids taper for respiratory failure r/t pneumonia, Neupogen for pancytopenia r/t recent chemotherapy with plan for 2units PRBC later this afternoon.  Nephrology consult pending for creatinine of 2.31 Upon visit- patient was on BiPap and sleeping.  Multiple family members at the bedside.  I met with the patient's family (Phyllis/wife, Kenny/son, ricky/son, Allison/DIL and [place]/SIL) to discuss hospice services.  The family would like to take [name] home, however, this will need to be determined as patient has no plans for discharge at this time.  I discussed at length hospice at home services and hospice home services.  Discussed goals of care and at this time- main concern is patient comfort with the hopes of improvement.  The family states no one has discussed prognosis at this time with them- but they understand that [name]'s condition is guarded.  They are not in opposed to the hospice home if the patient's condition declines and the [facility] will be the best place to manage his symptoms.  They have abundant involved family and would like to take him home if [name]'s respiratory status improves.  We will re-asses on Monday and I will work with the team and family for a discharge plan.  Currently the patient has no DME in the home as he was in relatively good health prior to hospitalization.  Identified DME needs:  Hospital bed and oxygen-  (needs re-assessed closer to d/c.)  Patient is a DNR.  I suggested to the family a Palliative  Medicine Consult and all were in agreement with.  I passed this information along to [place].  Updated information faxed to referral intake.  Will continue to follow through final disposition.  Note:  Patient has Medicare that was not listed under patient demographics and Insurance.  I called and notified registration who entered the patient's medicare numbers. I also notified them that the patient's SS number was not correct in the system, and provided the correct SS number which I received from the patient's family and verified by the patient's Medicare card.  Later when I went back into the room- at 1530, the patient was on high flow oxygen and awake.  He verified his SS number which was the number on his Medicare card and the number I gave to registration.  Dimas Aguas, RN Clinical Nurse Liaison [facility]

## 2015-07-16 NOTE — Progress Notes (Signed)
Mitchell Nguyen   DOB:1945-06-07   BE#:[id number]    History is difficult to assess as patient feels weak/ high flow nasal oxygen.  Subjective:  No acute events overnight. Patient has been weaned off from the BiPAP; currently on high flow nasal oxygen. Continues to shortness of breath. Continues to feel weak. Complains of abdominal distention. No diarrhea.  ROS: difficult to assess.   Objective:  Filed Vitals:   07/16/15 1326 07/16/15 1505  BP:  111/56  Pulse: 90   Temp: 97.4 F (36.3 C) 97.3 F (36.3 C)  Resp: 14      Intake/Output Summary (Last 24 hours) at 07/16/15 1624 Last data filed at 07/16/15 0071  Gross per 24 hour  Intake 1361.65 ml  Output   1250 ml  Net 111.65 ml    GENERAL: Sick -appearing Caucasian male patient resting in the bed; mild respiratory distress; High flow nasal oxygen Accompanied by multiple family members including wife sister.  EYES: no pallor or icterus OROPHARYNX: poor dentition. Dry.  NECK: supple, no masses felt LYMPH: no palpable lymphadenopathy in the cervical, axillary or inguinal regions LUNGS: Decreased breath sounds at the right lower base; Bilateral coarse breath sounds.  HEART/CVS: Tachycardic irregularrhythm and no murmurs; No lower extremity edema ABDOMEN: abdomen soft, non-tender and normal bowel sounds Musculoskeletal:no cyanosis of digits and no clubbing  PSYCH: alert & oriented x 3 ; no focal deficits. NEURO: no focal motor/sensory deficits SKIN: no rashes or significant lesions   Labs:  Lab Results  Component Value Date   WBC 0.4* 07/16/2015   HGB 7.7* 07/16/2015   HCT 23.4* 07/16/2015   MCV 93.0 07/16/2015   PLT 35* 07/16/2015   NEUTROABS 0.2* 07/16/2015    Lab Results  Component Value Date   NA 138 07/16/2015   K 5.2* 07/16/2015   CL 106 07/16/2015   CO2 18* 07/16/2015    Studies:  Ct Abdomen Pelvis Wo Contrast  07/15/2015  CLINICAL DATA:  Stage IV lung cancer with metastatic pain. Shortness of  breath and abdominal pain EXAM: CT ANGIOGRAPHY CHEST CT ABDOMEN AND PELVIS WITHOUT CONTRAST TECHNIQUE: CT OF THE ABDOMEN WAS PERFORMED WITHOUT CONTRAST. Multidetector CT imaging of the chest was performed using the standard protocol during bolus administration of intravenous contrast. Multiplanar CT image reconstructions and MIPs were obtained to evaluate the vascular anatomy. CONTRAST:  70 cc Isovue 370 intravenous COMPARISON:  PET-CT 06/04/2015 FINDINGS: CTA CHEST FINDINGS THORACIC INLET/BODY WALL: Porta catheter on the right with tip in good position. MEDIASTINUM: Normal heart size. Small pericardial effusion which is similar to February 2017. No superimposed nodularity. Patient's right middle lobe cancer exerts moderate mass effect on the atrium of the right lateral ventricle. Negative for acute pulmonary embolism. No acute arterial finding. Atherosclerosis with left coronary stents. LUNG WINDOWS: Severe emphysema, with bullous changes in the apical lungs. Right middle lobe is distended by a poorly enhancing 12 cm malignancy. There is airspace disease in the right lower lobe which is new from prior compatible with pneumonia. Small layering right pleural effusion OSSEOUS: Right posterior and lateral fifth rib fracture metastasis with fracture that is essentially nondisplaced. The metastasis and fracture have progressed/developed since PET CT comparison CT ABDOMEN and PELVIS FINDINGS Abdominal wall:  No contributory findings. Hepatobiliary: No focal liver abnormality.No evidence of biliary obstruction or stone. Pancreas: Unremarkable. Spleen: Unremarkable. 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 No hydronephrosis or stone. Bladder is decompressed by  a well-positioned Foley catheter. Reproductive:No acute finding. Stomach/Bowel: Diffusely distended colon with proximal fluid levels no mechanical obstruction seen. No wall  thickening. No small bowel obstruction. Vascular/Lymphatic: No acute vascular abnormality. No mass or adenopathy. Peritoneal: No ascites or pneumoperitoneum. Musculoskeletal: Enlarging sacral metastasis which invades the sacral canal and infiltrates the right S1/ S3 and bilateral S2 anterior foramina. Bulky mass centered in the left psoas and intrinsic back muscles and invading the left aspect of the L4 vertebra with body pathologic fracture. There is left L4 nerve root compromise. Review of the MIP images confirms the above findings. IMPRESSION: 1. Negative for pulmonary embolism. 2. Right lower lobe pneumonia or aspiration with small effusion. 3. Stage IV lung cancer with diffuse progression. Pathologic fractures of the L4 vertebral body and right fifth rib. 4. Colonic ileus pattern. 5. Severe, bullous emphysema. Electronically Signed   By: Monte Fantasia M.D.   On: 07/15/2015 13:16   Ct Angio Chest Pe W/cm &/or Wo Cm  07/15/2015  CLINICAL DATA:  Stage IV lung cancer with metastatic pain. Shortness of breath and abdominal pain EXAM: CT ANGIOGRAPHY CHEST CT ABDOMEN AND PELVIS WITHOUT CONTRAST TECHNIQUE: CT OF THE ABDOMEN WAS PERFORMED WITHOUT CONTRAST. Multidetector CT imaging of the chest was performed using the standard protocol during bolus administration of intravenous contrast. Multiplanar CT image reconstructions and MIPs were obtained to evaluate the vascular anatomy. CONTRAST:  70 cc Isovue 370 intravenous COMPARISON:  PET-CT 06/04/2015 FINDINGS: CTA CHEST FINDINGS THORACIC INLET/BODY WALL: Porta catheter on the right with tip in good position. MEDIASTINUM: Normal heart size. Small pericardial effusion which is similar to February 2017. No superimposed nodularity. Patient's right middle lobe cancer exerts moderate mass effect on the atrium of the right lateral ventricle. Negative for acute pulmonary embolism. No acute arterial finding. Atherosclerosis with left coronary stents. LUNG WINDOWS: Severe  emphysema, with bullous changes in the apical lungs. Right middle lobe is distended by a poorly enhancing 12 cm malignancy. There is airspace disease in the right lower lobe which is new from prior compatible with pneumonia. Small layering right pleural effusion OSSEOUS: Right posterior and lateral fifth rib fracture metastasis with fracture that is essentially nondisplaced. The metastasis and fracture have progressed/developed since PET CT comparison CT ABDOMEN and PELVIS FINDINGS Abdominal wall:  No contributory findings. Hepatobiliary: No focal liver abnormality.No evidence of biliary obstruction or stone. Pancreas: Unremarkable. Spleen: Unremarkable. 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 No hydronephrosis or stone. Bladder is decompressed by a well-positioned Foley catheter. Reproductive:No acute finding. Stomach/Bowel: Diffusely distended colon with proximal fluid levels no mechanical obstruction seen. No wall thickening. No small bowel obstruction. Vascular/Lymphatic: No acute vascular abnormality. No mass or adenopathy. Peritoneal: No ascites or pneumoperitoneum. Musculoskeletal: Enlarging sacral metastasis which invades the sacral canal and infiltrates the right S1/ S3 and bilateral S2 anterior foramina. Bulky mass centered in the left psoas and intrinsic back muscles and invading the left aspect of the L4 vertebra with body pathologic fracture. There is left L4 nerve root compromise. Review of the MIP images confirms the above findings. IMPRESSION: 1. Negative for pulmonary embolism. 2. Right lower lobe pneumonia or aspiration with small effusion. 3. Stage IV lung cancer with diffuse progression. Pathologic fractures of the L4 vertebral body and right fifth rib. 4. Colonic ileus pattern. 5. Severe, bullous emphysema. Electronically Signed   By: Monte Fantasia M.D.   On: 07/15/2015 13:16    Assessment & Plan:   #  Metastatic squamous cell lung cancer status post carboplatin Taxol cycle #1 day #8  Poor tolerance to chemotherapy.  # Pancytopenia/severe neutropenia white count 0.4/hemoglobin 7.7 platelets 35-Neupogen D#3.  Discussed regarding PRBC transfusion. Agree. Order 2 units of PRBC.  #  Acute respiratory failure-  COPD underlying lung cancer/ Pneumonitis.  Continued steroids /antibiotics.  # Acute renal failure creatinine 2.3 ; potassium 5.2.-Multifactorial sepsis/hypotension; patient also received contrast yesterday.  # A. fib currently on Cardizem/amiodarone.   #  Discussed with patient family/ and Dr. Tressia Miners.  Guarded prognosis. We will await to see if patient makes clinical improvement over the next few days- as patient is PDL-1 positive; he should have a good response to immunotherapy. Of course patient has to improve significantly to be considered for treatment. If patient does not make any progress in the next few days/ continues to worsen- hospice would be recommended.    Cammie Sickle, MD 07/16/2015  4:24 PM

## 2015-07-16 NOTE — Progress Notes (Addendum)
Pharmacy Antibiotic Note  Mitchell Nguyen is a 70 y.o. male admitted on 07/16/2015 with pneumonia and sepsis.  Pharmacy has been consulted for Zosyn dosing.  Plan:  Scr bumped significantly overnight; therefore will dose as if patient's Crcl <10 ml/min since Crcl can't accurately measure true renal function in AKI.  Will change to  Zosyn 3.375 g IV q12 hours.   Height: 6' (182.9 cm) Weight: 152 lb 12.5 oz (69.3 kg) IBW/kg (Calculated) : 77.6  Temp (24hrs), Avg:97.5 F (36.4 C), Min:96.7 F (35.9 C), Max:98.1 F (36.7 C)   Recent Labs Lab 07/08/2015 1122 07/11/2015 1414 07/14/15 0505 07/15/15 0844 07/16/15 0529  WBC 2.2* 1.9* 0.6* 0.3* 0.4*  CREATININE 1.41* 1.35* 1.44* 1.58* 2.31*  LATICACIDVEN 1.6  --   --   --   --   VANCOTROUGH  --  30*  --   --   --     Estimated Creatinine Clearance: 30.4 mL/min (by C-G formula based on Cr of 2.31).    Allergies  Allergen Reactions  . Lisinopril Anaphylaxis    Antimicrobials this admission: Vancomycin 4/18>> 4/19 Zosyn 4/18  >>   Dose adjustments this admission: Zosyn 3.375 g IV q12 hours.   Microbiology results: BCx: NGTD UCx:  NGTD Sputum:  pending MRSA PCR: NG  Thank you for allowing pharmacy to be a part of this patient's care.  Hattye Siegfried D 07/16/2015 9:00 AM

## 2015-07-16 NOTE — Progress Notes (Addendum)
PULMONARY / CRITICAL CARE MEDICINE   Name: Mitchell Nguyen MRN: [id number] DOB: [birth year]    ADMISSION DATE:  07/09/2015  BRIEF HISTORY: 70 year old male past medical history of stage IV non-small cell lung cancer with metastases to pain, currently on palliative chemotherapy, DO NOT RESUSCITATE, seen in consultation for respiratory distress after cardiac catheterization.   SUBJECTIVE:  Patient with stable respiratory status overnight, had abdominal pain. CT A/P with mild colonic illeus.  Had flatus overnight. Getting unit of PRBC today.  VITAL SIGNS: Temp:  [96.7 F (35.9 C)-98.1 F (36.7 C)] 97.8 F (36.6 C) (04/21 0400) Pulse Rate:  [37-122] 92 (04/21 0600) Resp:  [14-38] 18 (04/21 0600) BP: (83-132)/(62-100) 92/68 mmHg (04/21 0600) SpO2:  [95 %-100 %] 97 % (04/21 0740) FiO2 (%):  [30 %-35 %] 35 % (04/21 0740) Weight:  [152 lb 12.5 oz (69.3 kg)] 152 lb 12.5 oz (69.3 kg) (04/21 0500) HEMODYNAMICS:   VENTILATOR SETTINGS: Vent Mode:  [-]  FiO2 (%):  [30 %-35 %] 35 % INTAKE / OUTPUT:  Intake/Output Summary (Last 24 hours) at 07/16/15 0750 Last data filed at 07/16/15 0600  Gross per 24 hour  Intake 2163.65 ml  Output   1250 ml  Net 913.65 ml    Review of Systems  Constitutional: Positive for malaise/fatigue. Negative for fever and chills.  Respiratory: Positive for cough, shortness of breath and wheezing.   Cardiovascular: Positive for palpitations.  Gastrointestinal: Negative for heartburn.  Genitourinary: Negative for dysuria.  Musculoskeletal: Negative for myalgias.  Skin: Negative for rash.  Neurological: Positive for weakness. Negative for dizziness and headaches.  Endo/Heme/Allergies: Does not bruise/bleed easily.  Psychiatric/Behavioral: Negative for depression.    Physical Exam  Constitutional: He is oriented to person, place, and time and well-developed, well-nourished, and in no distress.  HENT:  Head: Normocephalic.  Right Ear: External ear  normal.  Left Ear: External ear normal.  Cardiovascular: Intact distal pulses.   Irregular   Pulmonary/Chest: He has wheezes.  On HFNC, faint expiratory wheezes  Abdominal: Bowel sounds are normal. He exhibits no distension.  Musculoskeletal: Normal range of motion. He exhibits no edema.  Neurological: He is alert and oriented to person, place, and time.  Skin: Skin is warm.  Nursing note and vitals reviewed.    LABS:  CBC  Recent Labs Lab 07/14/15 0505 07/15/15 0844 07/16/15 0529  WBC 0.6* 0.3* 0.4*  HGB 8.0* 7.6* 7.7*  HCT 24.1* 22.8* 23.4*  PLT 63* 41* 35*   Coag's  Recent Labs Lab 07/10/2015 1122 07/08/2015 1414  APTT 35 65*  INR 1.15 1.51   BMET  Recent Labs Lab 07/14/15 0505 07/15/15 0844 07/16/15 0529  NA 131* 134* 138  K 5.0 4.7 5.2*  CL 101 104 106  CO2 20* 19* 18*  BUN 57* 76* 94*  CREATININE 1.44* 1.58* 2.31*  GLUCOSE 153* 178* 189*   Electrolytes  Recent Labs Lab 07/14/15 0505 07/15/15 0844 07/16/15 0529  CALCIUM 6.6* 6.8* 6.7*  MG 1.9  --   --   PHOS 3.9  --   --    Sepsis Markers  Recent Labs Lab 07/09/2015 1122  LATICACIDVEN 1.6   ABG  Recent Labs Lab 07/14/15 0442  PHART 7.38  PCO2ART 32  PO2ART 87   Liver Enzymes  Recent Labs Lab 07/09/15 0845  AST 19  ALT 36  ALKPHOS 118  BILITOT 0.6  ALBUMIN 2.5*   Cardiac Enzymes  Recent Labs Lab 07/08/2015 1122  TROPONINI 0.11*   Glucose  Recent Labs Lab 07/15/2015 1320  GLUCAP 243*    Imaging Ct Abdomen Pelvis Wo Contrast  07/15/2015  CLINICAL DATA:  Stage IV lung cancer with metastatic pain. Shortness of breath and abdominal pain EXAM: CT ANGIOGRAPHY CHEST CT ABDOMEN AND PELVIS WITHOUT CONTRAST TECHNIQUE: CT OF THE ABDOMEN WAS PERFORMED WITHOUT CONTRAST. Multidetector CT imaging of the chest was performed using the standard protocol during bolus administration of intravenous contrast. Multiplanar CT image reconstructions and MIPs were obtained to evaluate the  vascular anatomy. CONTRAST:  70 cc Isovue 370 intravenous COMPARISON:  PET-CT 06/04/2015 FINDINGS: CTA CHEST FINDINGS THORACIC INLET/BODY WALL: Porta catheter on the right with tip in good position. MEDIASTINUM: Normal heart size. Small pericardial effusion which is similar to February 2017. No superimposed nodularity. Patient's right middle lobe cancer exerts moderate mass effect on the atrium of the right lateral ventricle. Negative for acute pulmonary embolism. No acute arterial finding. Atherosclerosis with left coronary stents. LUNG WINDOWS: Severe emphysema, with bullous changes in the apical lungs. Right middle lobe is distended by a poorly enhancing 12 cm malignancy. There is airspace disease in the right lower lobe which is new from prior compatible with pneumonia. Small layering right pleural effusion OSSEOUS: Right posterior and lateral fifth rib fracture metastasis with fracture that is essentially nondisplaced. The metastasis and fracture have progressed/developed since PET CT comparison CT ABDOMEN and PELVIS FINDINGS Abdominal wall:  No contributory findings. Hepatobiliary: No focal liver abnormality.No evidence of biliary obstruction or stone. Pancreas: Unremarkable. Spleen: Unremarkable. 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 No hydronephrosis or stone. Bladder is decompressed by a well-positioned Foley catheter. Reproductive:No acute finding. Stomach/Bowel: Diffusely distended colon with proximal fluid levels no mechanical obstruction seen. No wall thickening. No small bowel obstruction. Vascular/Lymphatic: No acute vascular abnormality. No mass or adenopathy. Peritoneal: No ascites or pneumoperitoneum. Musculoskeletal: Enlarging sacral metastasis which invades the sacral canal and infiltrates the right S1/ S3 and bilateral S2 anterior foramina. Bulky mass centered in the left psoas and intrinsic back muscles  and invading the left aspect of the L4 vertebra with body pathologic fracture. There is left L4 nerve root compromise. Review of the MIP images confirms the above findings. IMPRESSION: 1. Negative for pulmonary embolism. 2. Right lower lobe pneumonia or aspiration with small effusion. 3. Stage IV lung cancer with diffuse progression. Pathologic fractures of the L4 vertebral body and right fifth rib. 4. Colonic ileus pattern. 5. Severe, bullous emphysema. Electronically Signed   By: Monte Fantasia M.D.   On: 07/15/2015 13:16   Ct Angio Chest Pe W/cm &/or Wo Cm  07/15/2015  CLINICAL DATA:  Stage IV lung cancer with metastatic pain. Shortness of breath and abdominal pain EXAM: CT ANGIOGRAPHY CHEST CT ABDOMEN AND PELVIS WITHOUT CONTRAST TECHNIQUE: CT OF THE ABDOMEN WAS PERFORMED WITHOUT CONTRAST. Multidetector CT imaging of the chest was performed using the standard protocol during bolus administration of intravenous contrast. Multiplanar CT image reconstructions and MIPs were obtained to evaluate the vascular anatomy. CONTRAST:  70 cc Isovue 370 intravenous COMPARISON:  PET-CT 06/04/2015 FINDINGS: CTA CHEST FINDINGS THORACIC INLET/BODY WALL: Porta catheter on the right with tip in good position. MEDIASTINUM: Normal heart size. Small pericardial effusion which is similar to February 2017. No superimposed nodularity. Patient's right middle lobe cancer exerts moderate mass effect on the atrium of the right lateral ventricle. Negative for acute pulmonary embolism. No acute arterial finding. Atherosclerosis with left coronary stents. LUNG WINDOWS: Severe  emphysema, with bullous changes in the apical lungs. Right middle lobe is distended by a poorly enhancing 12 cm malignancy. There is airspace disease in the right lower lobe which is new from prior compatible with pneumonia. Small layering right pleural effusion OSSEOUS: Right posterior and lateral fifth rib fracture metastasis with fracture that is essentially  nondisplaced. The metastasis and fracture have progressed/developed since PET CT comparison CT ABDOMEN and PELVIS FINDINGS Abdominal wall:  No contributory findings. Hepatobiliary: No focal liver abnormality.No evidence of biliary obstruction or stone. Pancreas: Unremarkable. Spleen: Unremarkable. 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 No hydronephrosis or stone. Bladder is decompressed by a well-positioned Foley catheter. Reproductive:No acute finding. Stomach/Bowel: Diffusely distended colon with proximal fluid levels no mechanical obstruction seen. No wall thickening. No small bowel obstruction. Vascular/Lymphatic: No acute vascular abnormality. No mass or adenopathy. Peritoneal: No ascites or pneumoperitoneum. Musculoskeletal: Enlarging sacral metastasis which invades the sacral canal and infiltrates the right S1/ S3 and bilateral S2 anterior foramina. Bulky mass centered in the left psoas and intrinsic back muscles and invading the left aspect of the L4 vertebra with body pathologic fracture. There is left L4 nerve root compromise. Review of the MIP images confirms the above findings. IMPRESSION: 1. Negative for pulmonary embolism. 2. Right lower lobe pneumonia or aspiration with small effusion. 3. Stage IV lung cancer with diffuse progression. Pathologic fractures of the L4 vertebral body and right fifth rib. 4. Colonic ileus pattern. 5. Severe, bullous emphysema. Electronically Signed   By: Monte Fantasia M.D.   On: 07/15/2015 13:16    STUDIES:   07/23/2015 Left heart cath indicative of Mid RCA lesion, 25% stenosed. The lesion was previously treated with a stent (unknown type).Dist LAD lesion, 15% stenosed. The lesion was previously treated with a stent (unknown type) greater than two years ago.Normal LVF EF=55%No significant CAD Widely patent stents CTA Chest 4/20 ->no pe, RLL PNA CT a/p 4/20>>colonic  ileus,pathologic L4 vertebral body and right fifth rib fx  CULTURES: 4/18 BC>> 4/18 UC>> 4/18 [place]>> ANTIBIOTICS: 4/18 Vancomycin>> 4/18 Zosyn>>  SIGNIFICANT EVENTS: none  LINES/TUBES: none  DISCUSSION: 70 yo male with Hypertension, hyperlipidemia, previous MI X 2, [employer] abuse, lung mass, now presenting with STEMI and PNA. Patient had a left heat cath and was transferred to ICU on BiPAP for further management.  ASSESSMENT / PLAN:  PULMONARY A: Acute on chronic Hypoxemic respiratory failure - improving COPD PNA Lung mass - Stage IV NSCLC with brain mets P:  Continue HFNC To keep sats>88% Routine ABG CXR prn Vanc/ zosyn for HCAP Followed by oncology as an outpatient Methyl prednisone 60 mg twice and then taper Albuterol/pulmicort/atrovent Incentive spirometry, flutter valve.  CTA chest with no PE.   CARDIOVASCULAR A:  Hx of Hypertension, hyperlipidemia New onset Afib P:  - given amiodarone bolus, started on digoxin - cards following  RENAL A:  Acute Kidney injury Hyponatremia,Hyperkalemia - improving Dehydration - start NS'@50cc'$ /hr, this should assist with kidney fch Poor Appetite  P:  Replace electrolytes. Follow chemistry  GASTROINTESTINAL A:  Possible colonic ileus P:  On soft diet Protonix for GI prophylaxis BM regiment CT a/p with possible colonic ileus - had flatus overnight  HEMATOLOGIC A:  Stage IV Lung Ca with mets L4 vertebral Fx Right 5th fx Palliative Chemo - now with nadir (low counts) P:  Heparin for DVT prophylaxis Pain management - fentanyl patch 84mg, schedule percocet for fractures (these are new)/  2u PRBC today  INFECTIOUS A:  HCAP P:  Follow cultures Antibiotics as above Follow CBC in am  ENDOCRINE A:  No active issues P:  blood sugar checks intermittently  NEUROLOGIC A:  No active issues P:  RASS goal:0  CODE STATUS: DNR  SOCIAL - family updated on clinical status -  family requested information on Hospice, social worker consulted.   Thank you for consulting [facility], Please feel free to contacts [place] with any questions at [phone] (please enter 7-digits).  I have personally obtained a history, examined the patient, evaluated laboratory and imaging results, formulated the assessment and plan and placed orders.  Pulmonary Care Time devoted to patient care services described in this note is 35 minutes.     Vilinda Boehringer, MD [facility] Pager [phone] (please enter 7-digits) On Call Pager [phone] (please enter 7-digits)  Note: This note was prepared with Dragon dictation along with smaller phrase technology. Any transcriptional errors that result from this process are unintentional.

## 2015-07-16 NOTE — Progress Notes (Signed)
Chaplain rounded the unit and provided a compassionate presence and spiritual support to the patient.  Mitchell Nguyen [phone]

## 2015-07-16 NOTE — Consult Note (Signed)
Palliative Medicine Inpatient Consult Note   Name: Mitchell Nguyen Date: 07/16/2015 MRN: [id number]  DOB: February 14, 1946  Referring Physician: Gladstone Lighter, MD  Palliative Care consult requested for this 69 y.o. male for goals of medical therapy in patient with acute on chronic respiratory failure due to lung cancer.  TODAY'S DISCUSSIONS AND DECISIONS: 1.  Pt is DNR / DNI and this is to continue.  I would recommend a conversation soon about 'one way' tapering off of Hi Flow/ bipap (not putting him back on prior levels once lowered etc). I did not get to this but will on Monday if still appropriate at that time.   2.  Hospice Liaison met with family --but it was a premature consult as there is not a clear discharge plan at this time.   Nonetheless, she will follow up with pt/ family on Monday.  She and I have talked by phone today.  She recommended a Palliative Care Consult until discharge plans are clear.  Pt cannot go anywhere on Hi Flow and cannot come off of it at this point.    3.  I met with pt's wife, his two sons, and pt's wife's sister (who was very helpful).  They are a fine caring family, but they are at this point a bit confused about what to do. They tend to agree to whatever MDs are talking about.  They have today agreed to 'comfort care', dialysis, NG tube, Cardizem drip, Hi Flow, hospice, and 'waiting a few more days to see', etc.  All of these things seem to be what we want to do so they are agreeing.  Family made it clear that they do NOT want him to suffer.    4. Pt is currently getting aggressive care. But, family wants DNR/DNI to continue and they want him kept comfortable. Also, they do not want aggressive care to go on too long. They want it to be somewhat limited and then if it is not working, they will transition to full comfort care. That could end up being terminal care here if he is not able to wean off the Hi Flow.  Given his kidney failure, he is unlikely to make it  home alive and off of Hi flow, but it could happen.    5.  Dr. Mortimer Fries may have further conversations with family tomorrow about changing pt to full comfort care (if pt is worse in the am). I will start some comfort med orders to add to the aggressive orders to facilitate that transition to comfort care if it takes place    See narrative below.  See orders.     CLINICAL NARRATIVE: Pt is a 70 yo man who has metastatic squamous cell lung cancer who came in short of breath.  He has been a lifelong smoker and has not been one to see doctors.  He developed a headache, some dizziness, and some low back pain and this prompted his wife to have him seen sometime in late February.  In March, a work up of his symptoms revealed already metastatic lung cancer.  He was seen by Dr. Rogue Bussing and a plan was set forth for palliative chemo and radiation.  He has a large right middle lob mass of about 9 cm in size (per PET scan), a soft tissue mass of the sacrum, an adrenal mass, boney mets including new L4 fracture and a right rib fracture, and a 4 mm left parietal brain met.  He finished ratdiation for bone  mets on April 5th.  He is on Decadron for the brain met and XRT has been completed for this also.    He was noted to have ST elevation and he had a heart cath at time of admission.  He had some disease noted on cath done on admission on 4/18, but no occlusive disease.    He was found to be post-obstructive pneumonia. He was treated with BIPAP and has been transitioned to HI FLOW oxygen.  He had a CT with contrast to evaluate his cancer.  His renal function is currently shutting down.  His creatinine (which is 1.0 at baseline per recent notes) has gone from 1.58 to 2.31.  His urine output has dropped to 175 ml last shift.  He is on 35% / 45 liters HI Flow ---he could not go home or anywhere needing this degree of support.  He has had a Cardizem drip for AFib rvr (also on amiodarone).  He has a Foley but scant urine  outpuit.  He has an ileus with last BM yesterday.  He has been on Fentanyl patch and Vicodin at home.    He decided on DNR prior to this hospital stay. Family has been generally agreeable to whatever is presented to them. This has varied depending on who is talking to them about what.  Someone mentioned Hospice in the home setting and a consult was obtained from [facility].  The patient might actually not survive to go home, so this turned into a goals of care discussion, since there is not any chance of him going home soon given the high degree of Hi FLow oxygen he is on. If we take him off, he would die here at this time.  Tapering him off of Hi Flow might only be possible if the immunotherapy he received recently actually works to reduce the large lung mass SOON.  The Hospice Liaison recommended a Palliative Care consult since much is undetermined at this point in time.  Pt had a transfusion of 2 units of packed red blood cells.  Nephrology has been consulted and dialysis has been discussed with family.  They have generally agreed to whatever has been presented to them for consideration. So if dialysis is presented, they agree. If Hospice is presented they agree. If 'seeing how he does for the next few days is presented', they agree. So at this point in time, we have a family that is 'ready' to make sure he is comfortable and not suffering any longer, but they also want to follow the doctor's recommendations to continue current care 'for a few more days'.    I have discussed all above with Dr Mortimer Fries, critical care, nursing, and Hospice Liaison.  Pt may not survive the weekend.  If he does, I will see him and family as a high priority patient Monday AFTERNOON.    Note that pt's wife had chest pain, GERD, panic event that she attributed to 'too many visitors spending too long in the patient's room and also in the waiting room'.  She recovered and family took her out the 'back way' to avoid the crowd.   Nursing was updated about this matter.    REVIEW OF SYSTEMS:  Patient is not able to provide ROS due to dyspnea  SPIRITUAL SUPPORT SYSTEM: Yes.  SOCIAL HISTORY:  reports that he quit smoking about 7 months ago. His smoking use included Cigarettes. He has a 50 pack-year smoking history. He has never used smokeless tobacco. He  reports that he does not drink alcohol or use illicit drugs.  Married. Wife is in room. Pt has two adult sons involved in his care. One son, Lennette Bihari, is listed in demographics as second contact after pt's wife.    LEGAL DOCUMENTS:  I signed the DNR portable document in the paper chart.  CODE STATUS: DNR  PAST MEDICAL HISTORY: Past Medical History  Diagnosis Date  . Hypertension   . MI (myocardial infarction) ([place])     x2 (2012 and 2014) at [facility]  . Hyperlipidemia   . Lung mass   . COPD (chronic obstructive pulmonary disease) ([place])   . Cough   . Emphysema of lung ([place])   . Shortness of breath   . Constipation   . Arthritis   . Low back pain   . ST elevation myocardial infarction (STEMI) ([place])   . History of tobacco abuse   . Hx of cardiac cath     PAST SURGICAL HISTORY:  Past Surgical History  Procedure Laterality Date  . Cardiac surgery      stents placed x2  . Cardiac catheterization    . Peripheral vascular catheterization N/A 06/28/2015    Procedure: Glori Luis Cath Insertion;  Surgeon: Algernon Huxley, MD;  Location: [facility];  Service: Cardiovascular;  Laterality: N/A;  . Cardiac catheterization N/A 07/16/2015    Procedure: Left Heart Cath and Coronary Angiography;  Surgeon: Yolonda Kida, MD;  Location: [facility];  Service: Cardiovascular;  Laterality: N/A;    ALLERGIES:  is allergic to lisinopril.  MEDICATIONS:  Current Facility-Administered Medications  Medication Dose Route Frequency Provider Last Rate Last Dose  . 0.9 %  sodium chloride infusion   Intravenous Continuous Orbie Pyo, MD 20 mL/hr at 06/27/2015  1157 20 mL at 07/04/2015 1157  . 0.9 %  sodium chloride infusion   Intravenous Continuous Vishal Mungal, MD      . acetaminophen (TYLENOL) tablet 650 mg  650 mg Oral Q4H PRN Dwayne D Callwood, MD      . antiseptic oral rinse (CPC / CETYLPYRIDINIUM CHLORIDE 0.05%) solution 7 mL  7 mL Mouth Rinse BID Vishal Mungal, MD   7 mL at 07/16/15 1239  . budesonide (PULMICORT) nebulizer solution 0.5 mg  0.5 mg Nebulization BID Bincy S Varughese, NP   0.5 mg at 07/16/15 0735  . diltiazem (CARDIZEM) 100 mg in dextrose 5 % 100 mL (1 mg/mL) infusion  5-15 mg/hr Intravenous Continuous Wilhelmina Mcardle, MD 5 mL/hr at 07/16/15 1708 5 mg/hr at 07/16/15 1708  . sennosides (SENOKOT) 8.8 MG/5ML syrup 5 mL  5 mL Oral BID Vishal Mungal, MD   5 mL at 07/16/15 1000   And  . docusate (COLACE) 50 MG/5ML liquid 100 mg  100 mg Oral BID Vishal Mungal, MD   100 mg at 07/16/15 1000  . [START ON 07/18/2015] famotidine (PEPCID) 40 MG/5ML suspension 20 mg  20 mg Oral Q48H Vishal Mungal, MD      . fentaNYL (DURAGESIC - dosed mcg/hr) 75 mcg  75 mcg Transdermal Q72H Vishal Mungal, MD   75 mcg at 07/15/15 1043  . guaiFENesin (MUCINEX) 12 hr tablet 600 mg  600 mg Oral BID Gladstone Lighter, MD   600 mg at 07/16/15 1516  . guaiFENesin-dextromethorphan (ROBITUSSIN DM) 100-10 MG/5ML syrup 10 mL  10 mL Oral Q6H PRN Gladstone Lighter, MD      . heparin lock flush 100 unit/mL  500 Units Intracatheter Daily PRN Lenetta Quaker R  Rogue Bussing, MD      . heparin lock flush 100 unit/mL  250 Units Intracatheter PRN Cammie Sickle, MD      . ipratropium (ATROVENT) nebulizer solution 0.5 mg  0.5 mg Nebulization Q6H Bincy S Varughese, NP   0.5 mg at 07/16/15 1311  . methylPREDNISolone sodium succinate (SOLU-MEDROL) 125 mg/2 mL injection 60 mg  60 mg Intravenous Q12H Bincy S Varughese, NP   60 mg at 07/16/15 1516  . ondansetron (ZOFRAN) injection 4 mg  4 mg Intravenous Q6H PRN Yolonda Kida, MD   4 mg at 07/07/2015 2111  . oxyCODONE-acetaminophen  (PERCOCET/ROXICET) 5-325 MG per tablet 1 tablet  1 tablet Oral TID Vilinda Boehringer, MD   1 tablet at 07/16/15 1517  . piperacillin-tazobactam (ZOSYN) IVPB 3.375 g  3.375 g Intravenous Q12H Orbie Pyo, MD   3.375 g at 07/16/15 1845  . sodium chloride flush (NS) 0.9 % injection 10 mL  10 mL Intracatheter PRN Cammie Sickle, MD      . Tbo-Filgrastim [facility]) injection 480 mcg  480 mcg Subcutaneous Daily Cammie Sickle, MD   480 mcg at 07/16/15 1333    Vital Signs: BP 121/92 mmHg  Pulse 96  Temp(Src) 97.8 F (36.6 C) (Oral)  Resp 19  Ht 6' (1.829 m)  Wt 69.3 kg (152 lb 12.5 oz)  BMI 20.72 kg/m2  SpO2 92% Filed Weights   07/14/15 0500 07/15/15 0500 07/16/15 0500  Weight: 74.8 kg (164 lb 14.5 oz) 70.2 kg (154 lb 12.2 oz) 69.3 kg (152 lb 12.5 oz)    Estimated body mass index is 20.72 kg/(m^2) as calculated from the following:   Height as of this encounter: 6' (1.829 m).   Weight as of this encounter: 69.3 kg (152 lb 12.5 oz).  PERFORMANCE STATUS (ECOG) : 4 - Bedbound  PHYSICAL EXAM: Lying in ICU bed, coughing a wet rattling cough ON HI FLOW at 45 Liters and 35% Has been on and off BIPAP NO  JVD or TM Hrt rrr no m pulse about 95 Lungs with diffuse ronchi Abd soft and NT Ext no mottling or cyanosis as yet   LABS: CBC:    Component Value Date/Time   WBC 0.4* 07/16/2015 0529   HGB 7.7* 07/16/2015 0529   HCT 23.4* 07/16/2015 0529   PLT 35* 07/16/2015 0529   MCV 93.0 07/16/2015 0529   NEUTROABS 0.2* 07/16/2015 0529   LYMPHSABS 0.1* 07/16/2015 0529   MONOABS 0.1* 07/16/2015 0529   EOSABS 0.0 07/16/2015 0529   BASOSABS 0.0 07/16/2015 0529   Comprehensive Metabolic Panel:    Component Value Date/Time   NA 138 07/16/2015 0529   K 5.2* 07/16/2015 0529   CL 106 07/16/2015 0529   CO2 18* 07/16/2015 0529   BUN 94* 07/16/2015 0529   CREATININE 2.31* 07/16/2015 0529   GLUCOSE 189* 07/16/2015 0529   CALCIUM 6.7* 07/16/2015 0529   AST 19 07/09/2015 0845    ALT 36 07/09/2015 0845   ALKPHOS 118 07/09/2015 0845   BILITOT 0.6 07/09/2015 0845   PROT 5.8* 07/09/2015 0845   ALBUMIN 2.5* 07/09/2015 0845    More than 50% of the visit was spent in counseling/coordination of care: Yes  Time Spent: 120 minutes

## 2015-07-16 NOTE — Progress Notes (Signed)
Palliative Care Update   I have been approached by Dr. Mortimer Fries about a Palliative Care consult for this unfortunate patient with stage 4 lung cancer, sepsis, pneumonia, and a distended abdomen.    I have noted that pt has been set up to be seen by the Hospice Liaison Nurse. Apparently this was for hospice in the home.  Pt does not have a clear discharge plan as yet, however.  Family would like to have pt be at home, but he might not be someone who would survive the trip off of hi flow, drips, and tubes.    Pt is currently on a high amount of Hi Flow and is now getting an NG tube placed for his distended abdomen.  And he is also on a Cardizem drip.  Much has to be done before pt is a candidate for leaving the hospital to go anywhere. It seems apparent that aggressive measures are continuing and then IF he becomes more stable, and OFF of HI FLOW and OFF of BIPAP --- then hospice could conceivably be set up in the home.  Hospice can set up very low volume Hi Flow (15 lpm and 35 % max) --but if on as low Hi Flow as this, then perhaps pt could be on 6 LPM nasal cannula.    Other scenarios exist and need to be considered. There is the possibility that he might not be weaned down off of Hi Flow --in which case a terminal wean/ withdrawal situation might need to be discussed with family.  There is the chance that Pt might be more appropriate for a Hospice Inpt setting (such as [place], [place]).  Other dispositions are possible. At this time, without any assurance that he may go home, hospice in the home cannot be set up.    I therefore agree that pt needs Palliative Care involvement to help with the taper of Hi Flow (if possible and whether in the ICU or on the floor) and to assist with family's ability to adapt their expectations based on changes in his condition that may take place over the next several days.    See full consult note to follow.  Colleen Can, MD

## 2015-07-16 NOTE — Care Management Note (Signed)
Case Management Note  Patient Details  Name: Mitchell Nguyen MRN: [id number] Date of Birth: Aug 18, 1945  Subjective/Objective:  Case discussed with Dr. Stevenson Clinch. He states he has discussed hospice with the family and they are agreeable to comfort care. Met with wife and sister in law at bedside. Discussed hospice and they would like to take patient home with hospice, "as long as they can make him comfortable." Offered choice of hospice agencies. They prefer [facility]. Referral to [place] with hospice. She will come and speak with family today.  Family updated.  It is anticipated that patient will be ready for discharge once breathing is more medically stable in approximately 2-3 days.                   Action/Plan: Hospice consult for home hospice.   Expected Discharge Date:                  Expected Discharge Plan:  Home w Hospice Care  In-House Referral:     Discharge planning Services  CM Consult  Post Acute Care Choice:  Hospice Choice offered to:  Spouse  DME Arranged:    DME Agency:     HH Arranged:  Disease Management [place] Agency:  [facility]  Status of Service:  In process, will continue to follow  Medicare Important Message Given:    Date Medicare IM Given:    Medicare IM give by:    Date Additional Medicare IM Given:    Additional Medicare Important Message give by:     If discussed at [place] of Stay Meetings, dates discussed:    Additional Comments:  Jolly Mango, RN 07/16/2015, 1:12 PM

## 2015-07-16 NOTE — Consult Note (Signed)
[facility]  CONSULT NOTE    Date: 07/16/2015                  Patient Name:  Mitchell Nguyen  MRN: [id number]  DOB: 1945-07-19  Age / Sex: 70 y.o., male         PCP: No PCP Per Patient                 Service Requesting Consult: Dr. Tressia Miners                 Reason for Consult: Acute Renal Failure            History of Present Illness: Mr. Archit Leger is a 70 y.o. white male with metastatic lung cancer, hypertension, coronary artery disease, COPD, hyperlipidemia, who was admitted to [facility] on 07/03/2015 for COPD exacerbation ([place]) [J44.1] ST elevation myocardial infarction (STEMI), unspecified artery ([place]) [I21.3] Sepsis, due to unspecified organism ([place]) [A41.9]  Family at bedside. Nephrology consulted for acute renal failure with hyperkalemia. Baseline creatinine of 1.19 on 4/17 has has progressive worsened to 2.31 with UOP of 1250. CT with contrast was given yesterday 4/20.   Medications: Outpatient medications: Prescriptions prior to admission  Medication Sig Dispense Refill Last Dose  . fentaNYL (DURAGESIC - DOSED MCG/HR) 25 MCG/HR patch Put it on the skin; every 3 days; use it along with 52m patch [total of 711m every 3 days] 10 patch 0 07/12/2015 at 1000  . fentaNYL (DURAGESIC - DOSED MCG/HR) 50 MCG/HR Place 1 patch (50 mcg total) onto the skin every 3 (three) days. 10 patch 0 07/12/2015 at 1000  . albuterol (PROAIR HFA) 108 (90 Base) MCG/ACT inhaler Inhale 2 Inhalers into the lungs every 6 (six) hours as needed. wheezing   Taking  . aspirin 81 MG tablet Take 81 mg by mouth daily. Reported on 07/12/2015   Not Taking at Unknown time  . chlorproMAZINE (THORAZINE) 10 MG tablet One table every 8 hours as needed for hiccups 30 tablet 0   . dexamethasone (DECADRON) 4 MG tablet Take one pill with breakfast/lunch (Patient not taking: Reported on 07/04/2015) 20 tablet 0 Not Taking at Unknown time  . docusate sodium (COLACE) 100 MG capsule Take 100 mg  by mouth 2 (two) times daily. Reported on 07/25/2015   Not Taking at Unknown time  . HYDROcodone-acetaminophen (NORCO/VICODIN) 5-325 MG tablet Take 1-2 tablets by mouth every 4 (four) hours as needed for moderate pain. 120 tablet 0 Taking  . Ipratropium-Albuterol (COMBIVENT) 20-100 MCG/ACT AERS respimat Inhale 1 puff into the lungs every 6 (six) hours. 1 Inhaler 0   . levofloxacin (LEVAQUIN) 500 MG tablet Take 1 tablet (500 mg total) by mouth daily. 7 tablet 0   . lidocaine-prilocaine (EMLA) cream Apply 1 application topically as needed. Apply to port then cover with saran wrap 1-2 hours before chemotherapy appointment 30 g 2 Taking  . losartan (COZAAR) 50 MG tablet Take 50 mg by mouth daily.   Taking  . magnesium hydroxide (MILK OF MAGNESIA) 400 MG/5ML suspension Take 5 mLs by mouth daily as needed for mild constipation.   Taking  . metoprolol (LOPRESSOR) 50 MG tablet Take 50 mg by mouth daily.    Taking  . NON FORMULARY Take 2 capsules by mouth daily. Reported on 06/18/2015   Taking  . ondansetron (ZOFRAN) 8 MG tablet Take 1 tablet (8 mg total) by mouth every 8 (eight) hours as needed for nausea or vomiting (start 3 days;  after chemo). 40 tablet 0 Taking  . polyethylene glycol (MIRALAX / GLYCOLAX) packet Take 17 g by mouth daily as needed for mild constipation. Reported on 07/09/2015   Not Taking  . predniSONE (DELTASONE) 20 MG tablet Take 3 tabs (60 mg) daily 21 tablet 1   . prochlorperazine (COMPAZINE) 10 MG tablet Take 1 tablet (10 mg total) by mouth every 6 (six) hours as needed for nausea or vomiting. 40 tablet 0 Taking  . tiotropium (SPIRIVA) 18 MCG inhalation capsule Place 1 capsule into inhaler and inhale daily.   Taking    Current medications: Current Facility-Administered Medications  Medication Dose Route Frequency Provider Last Rate Last Dose  . 0.9 %  sodium chloride infusion   Intravenous Continuous Orbie Pyo, MD 20 mL/hr at 07/02/2015 1157 20 mL at 07/11/2015 1157  . 0.9 %   sodium chloride infusion  250 mL Intravenous PRN Dwayne D Callwood, MD      . 0.9 %  sodium chloride infusion   Intravenous Continuous Wilhelmina Mcardle, MD 75 mL/hr at 07/15/15 2000    . 0.9 %  sodium chloride infusion   Intravenous Continuous Vishal Mungal, MD      . acetaminophen (TYLENOL) tablet 650 mg  650 mg Oral Q4H PRN Dwayne D Callwood, MD      . antiseptic oral rinse (CPC / CETYLPYRIDINIUM CHLORIDE 0.05%) solution 7 mL  7 mL Mouth Rinse BID Vishal Mungal, MD   7 mL at 07/16/15 1239  . budesonide (PULMICORT) nebulizer solution 0.5 mg  0.5 mg Nebulization BID Bincy S Varughese, NP   0.5 mg at 07/16/15 0735  . diltiazem (CARDIZEM) 100 mg in dextrose 5 % 100 mL (1 mg/mL) infusion  5-15 mg/hr Intravenous Continuous Wilhelmina Mcardle, MD 5 mL/hr at 07/16/15 1000 5 mg/hr at 07/16/15 1000  . sennosides (SENOKOT) 8.8 MG/5ML syrup 5 mL  5 mL Oral BID Vishal Mungal, MD   5 mL at 07/16/15 1000   And  . docusate (COLACE) 50 MG/5ML liquid 100 mg  100 mg Oral BID Vishal Mungal, MD   100 mg at 07/16/15 1000  . [START ON 07/18/2015] famotidine (PEPCID) 40 MG/5ML suspension 20 mg  20 mg Oral Q48H Vishal Mungal, MD      . fentaNYL ([place] - dosed mcg/hr) 75 mcg  75 mcg Transdermal Q72H Vishal Mungal, MD   75 mcg at 07/15/15 1043  . furosemide (LASIX) injection 20 mg  20 mg Intravenous Once Lavonia Dana, MD      . guaiFENesin (MUCINEX) 12 hr tablet 600 mg  600 mg Oral BID Gladstone Lighter, MD      . guaiFENesin-dextromethorphan (ROBITUSSIN DM) 100-10 MG/5ML syrup 10 mL  10 mL Oral Q6H PRN Gladstone Lighter, MD      . heparin lock flush 100 unit/mL  500 Units Intracatheter Daily PRN Cammie Sickle, MD      . heparin lock flush 100 unit/mL  250 Units Intracatheter PRN Cammie Sickle, MD      . ipratropium (ATROVENT) nebulizer solution 0.5 mg  0.5 mg Nebulization Q6H Bincy S Varughese, NP   0.5 mg at 07/16/15 1311  . methylPREDNISolone sodium succinate (SOLU-MEDROL) 125 mg/2 mL injection 60 mg  60  mg Intravenous Q12H Bincy S Varughese, NP   60 mg at 07/16/15 0311  . ondansetron (ZOFRAN) injection 4 mg  4 mg Intravenous Q6H PRN Yolonda Kida, MD   4 mg at 07/17/2015 2111  . oxyCODONE-acetaminophen (PERCOCET/ROXICET) 5-325 MG per  tablet 1 tablet  1 tablet Oral TID Vilinda Boehringer, MD   1 tablet at 07/16/15 1001  . piperacillin-tazobactam (ZOSYN) IVPB 3.375 g  3.375 g Intravenous Q12H Orbie Pyo, MD      . sodium chloride flush (NS) 0.9 % injection 10 mL  10 mL Intracatheter PRN Cammie Sickle, MD      . sodium chloride flush (NS) 0.9 % injection 3 mL  3 mL Intravenous Q12H Dwayne D Callwood, MD   3 mL at 07/16/15 1240  . sodium chloride flush (NS) 0.9 % injection 3 mL  3 mL Intravenous PRN Dwayne D Callwood, MD      . sodium chloride flush (NS) 0.9 % injection 3 mL  3 mL Intracatheter PRN Cammie Sickle, MD      . Tbo-Filgrastim [facility]) injection 480 mcg  480 mcg Subcutaneous Daily Cammie Sickle, MD   480 mcg at 07/16/15 1333      Allergies: Allergies  Allergen Reactions  . Lisinopril Anaphylaxis      Past Medical History: Past Medical History  Diagnosis Date  . Hypertension   . MI (myocardial infarction) ([place])     x2 (2012 and 2014) at [place]  . Hyperlipidemia   . Lung mass   . COPD (chronic obstructive pulmonary disease) ([place])   . Cough   . Emphysema of lung ([place])   . Shortness of breath   . Constipation   . Arthritis   . Low back pain   . ST elevation myocardial infarction (STEMI) ([place])   . History of tobacco abuse   . Hx of cardiac cath      Past Surgical History: Past Surgical History  Procedure Laterality Date  . Cardiac surgery      stents placed x2  . Cardiac catheterization    . Peripheral vascular catheterization N/A 06/28/2015    Procedure: Glori Luis Cath Insertion;  Surgeon: Algernon Huxley, MD;  Location: [place] [facility];  Service: Cardiovascular;  Laterality: N/A;  . Cardiac catheterization N/A 06/26/2015    Procedure:  Left Heart Cath and Coronary Angiography;  Surgeon: Yolonda Kida, MD;  Location: [place] [facility];  Service: Cardiovascular;  Laterality: N/A;     Family History: Family History  Problem Relation Age of Onset  . Hypertension    . Other Brother     "blood disorder"  . Heart failure Mother   . Heart disease Brother   . Heart disease Brother   . Heart disease       Social History: Social History   Social History  . Marital Status: Married    Spouse Name: N/A  . Number of Children: N/A  . Years of Education: N/A   Occupational History  . Not on file.   Social History Main Topics  . Smoking status: Former Smoker -- 1.00 packs/day for 50 years    Types: Cigarettes    Quit date: 11/26/2014  . Smokeless tobacco: Never Used     Comment: "stopped 6 months ago" 2016  . Alcohol Use: No  . Drug Use: No  . Sexual Activity: Not on file   Other Topics Concern  . Not on file   Social History Narrative     Review of Systems: Review of Systems  Unable to perform ROS: critical illness    Vital Signs: Blood pressure 83/58, pulse 90, temperature 97.4 F (36.3 C), temperature source Axillary, resp. rate 14, height 6' (1.829 m), weight 69.3 kg (152  lb 12.5 oz), SpO2 98 %.  Weight trends: Filed Weights   07/14/15 0500 07/15/15 0500 07/16/15 0500  Weight: 74.8 kg (164 lb 14.5 oz) 70.2 kg (154 lb 12.2 oz) 69.3 kg (152 lb 12.5 oz)    Physical Exam: General: Critically ill  Head: +bipap  Eyes: Anicteric, PERRL  Neck: Supple, trachea midline, right port a cath  Lungs:  Diminished bilaterally   Heart: Regular rate and rhythm  Abdomen:  +distended  Extremities: trace peripheral edema.  Neurologic: Nonfocal, moving all four extremities, following commands  Skin: No lesions  Access: none     Lab results: Basic Metabolic Panel:  Recent Labs Lab 07/14/15 0505 07/15/15 0844 07/16/15 0529  NA 131* 134* 138  K 5.0 4.7 5.2*  CL 101 104 106  CO2 20* 19* 18*   GLUCOSE 153* 178* 189*  BUN 57* 76* 94*  CREATININE 1.44* 1.58* 2.31*  CALCIUM 6.6* 6.8* 6.7*  MG 1.9  --   --   PHOS 3.9  --   --     Liver Function Tests: No results for input(s): AST, ALT, ALKPHOS, BILITOT, PROT, ALBUMIN in the last 168 hours. No results for input(s): LIPASE, AMYLASE in the last 168 hours. No results for input(s): AMMONIA in the last 168 hours.  CBC:  Recent Labs Lab 07/04/2015 1122 06/30/2015 1414 07/14/15 0505 07/15/15 0844 07/16/15 0529  WBC 2.2* 1.9* 0.6* 0.3* 0.4*  NEUTROABS 1.9  --   --  0.2* 0.2*  HGB 9.2* 8.7* 8.0* 7.6* 7.7*  HCT 27.9* 26.6* 24.1* 22.8* 23.4*  MCV 91.4 91.6 92.7 89.7 93.0  PLT 89* 81* 63* 41* 35*    Cardiac Enzymes:  Recent Labs Lab 07/24/2015 1122  TROPONINI 0.11*    BNP: Invalid input(s): POCBNP  CBG:  Recent Labs Lab 07/01/2015 1320  GLUCAP 243*    Microbiology: Results for orders placed or performed during the hospital encounter of 07/06/2015  MRSA PCR Screening     Status: None   Collection Time: 07/24/2015  1:27 PM  Result Value Ref Range Status   MRSA by PCR NEGATIVE NEGATIVE Final    Comment:        The GeneXpert MRSA Assay (FDA approved for NASAL specimens only), is one component of a comprehensive MRSA colonization surveillance program. It is not intended to diagnose MRSA infection nor to guide or monitor treatment for MRSA infections.   Blood Culture (routine x 2)     Status: None (Preliminary result)   Collection Time: 07/24/2015  2:14 PM  Result Value Ref Range Status   Specimen Description BLOOD RIGHT HAND  Final   Special Requests BOTTLES DRAWN AEROBIC AND ANAEROBIC  10CC   Final   Culture NO GROWTH 3 DAYS  Final   Report Status PENDING  Incomplete  Blood Culture (routine x 2)     Status: None (Preliminary result)   Collection Time: 07/07/2015  2:14 PM  Result Value Ref Range Status   Specimen Description BLOOD LEFT HAND  Final   Special Requests BOTTLES DRAWN AEROBIC AND ANAEROBIC  [place]  Final    Culture NO GROWTH 3 DAYS  Final   Report Status PENDING  Incomplete  Culture, expectorated sputum-assessment     Status: None   Collection Time: 07/07/2015  3:06 PM  Result Value Ref Range Status   Specimen Description SPUTUM  Final   Special Requests NONE  Final   Sputum evaluation   Final    Sputum specimen not acceptable for testing.  Please recollect.   CRITICAL RESULT CALLED TO, READ BACK BY AND VERIFIED WITH: CHRISYINA MILES 07/05/2015 1830 SDR    Report Status 07/14/2015 FINAL  Final  Urine culture     Status: None   Collection Time: 07/06/2015  4:04 PM  Result Value Ref Range Status   Specimen Description URINE, RANDOM  Final   Special Requests NONE  Final   Culture NO GROWTH 2 DAYS  Final   Report Status 07/15/2015 FINAL  Final    Coagulation Studies: No results for input(s): LABPROT, INR in the last 72 hours.  Urinalysis:  Recent Labs  07/11/2015 1604  COLORURINE YELLOW*  LABSPEC 1.035*  PHURINE 5.0  GLUCOSEU NEGATIVE  HGBUR NEGATIVE  BILIRUBINUR NEGATIVE  KETONESUR NEGATIVE  PROTEINUR NEGATIVE  NITRITE NEGATIVE  LEUKOCYTESUR NEGATIVE      Imaging: Ct Abdomen Pelvis Wo Contrast  07/15/2015  CLINICAL DATA:  Stage IV lung cancer with metastatic pain. Shortness of breath and abdominal pain EXAM: CT ANGIOGRAPHY CHEST CT ABDOMEN AND PELVIS WITHOUT CONTRAST TECHNIQUE: CT OF THE ABDOMEN WAS PERFORMED WITHOUT CONTRAST. Multidetector CT imaging of the chest was performed using the standard protocol during bolus administration of intravenous contrast. Multiplanar CT image reconstructions and MIPs were obtained to evaluate the vascular anatomy. CONTRAST:  70 cc Isovue 370 intravenous COMPARISON:  PET-CT 06/04/2015 FINDINGS: CTA CHEST FINDINGS THORACIC INLET/BODY WALL: Porta catheter on the right with tip in good position. MEDIASTINUM: Normal heart size. Small pericardial effusion which is similar to February 2017. No superimposed nodularity. Patient's right middle lobe cancer  exerts moderate mass effect on the atrium of the right lateral ventricle. Negative for acute pulmonary embolism. No acute arterial finding. Atherosclerosis with left coronary stents. LUNG WINDOWS: Severe emphysema, with bullous changes in the apical lungs. Right middle lobe is distended by a poorly enhancing 12 cm malignancy. There is airspace disease in the right lower lobe which is new from prior compatible with pneumonia. Small layering right pleural effusion OSSEOUS: Right posterior and lateral fifth rib fracture metastasis with fracture that is essentially nondisplaced. The metastasis and fracture have progressed/developed since PET CT comparison CT ABDOMEN and PELVIS FINDINGS Abdominal wall:  No contributory findings. Hepatobiliary: No focal liver abnormality.No evidence of biliary obstruction or stone. Pancreas: Unremarkable. Spleen: Unremarkable. 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 No hydronephrosis or stone. Bladder is decompressed by a well-positioned Foley catheter. Reproductive:No acute finding. Stomach/Bowel: Diffusely distended colon with proximal fluid levels no mechanical obstruction seen. No wall thickening. No small bowel obstruction. Vascular/Lymphatic: No acute vascular abnormality. No mass or adenopathy. Peritoneal: No ascites or pneumoperitoneum. Musculoskeletal: Enlarging sacral metastasis which invades the sacral canal and infiltrates the right S1/ S3 and bilateral S2 anterior foramina. Bulky mass centered in the left psoas and intrinsic back muscles and invading the left aspect of the L4 vertebra with body pathologic fracture. There is left L4 nerve root compromise. Review of the MIP images confirms the above findings. IMPRESSION: 1. Negative for pulmonary embolism. 2. Right lower lobe pneumonia or aspiration with small effusion. 3. Stage IV lung cancer with diffuse progression. Pathologic fractures  of the L4 vertebral body and right fifth rib. 4. Colonic ileus pattern. 5. Severe, bullous emphysema. Electronically Signed   By: Monte Fantasia M.D.   On: 07/15/2015 13:16   Ct Angio Chest Pe W/cm &/or Wo Cm  07/15/2015  CLINICAL DATA:  Stage IV lung cancer with metastatic pain. Shortness of breath  and abdominal pain EXAM: CT ANGIOGRAPHY CHEST CT ABDOMEN AND PELVIS WITHOUT CONTRAST TECHNIQUE: CT OF THE ABDOMEN WAS PERFORMED WITHOUT CONTRAST. Multidetector CT imaging of the chest was performed using the standard protocol during bolus administration of intravenous contrast. Multiplanar CT image reconstructions and MIPs were obtained to evaluate the vascular anatomy. CONTRAST:  70 cc Isovue 370 intravenous COMPARISON:  PET-CT 06/04/2015 FINDINGS: CTA CHEST FINDINGS THORACIC INLET/BODY WALL: Porta catheter on the right with tip in good position. MEDIASTINUM: Normal heart size. Small pericardial effusion which is similar to February 2017. No superimposed nodularity. Patient's right middle lobe cancer exerts moderate mass effect on the atrium of the right lateral ventricle. Negative for acute pulmonary embolism. No acute arterial finding. Atherosclerosis with left coronary stents. LUNG WINDOWS: Severe emphysema, with bullous changes in the apical lungs. Right middle lobe is distended by a poorly enhancing 12 cm malignancy. There is airspace disease in the right lower lobe which is new from prior compatible with pneumonia. Small layering right pleural effusion OSSEOUS: Right posterior and lateral fifth rib fracture metastasis with fracture that is essentially nondisplaced. The metastasis and fracture have progressed/developed since PET CT comparison CT ABDOMEN and PELVIS FINDINGS Abdominal wall:  No contributory findings. Hepatobiliary: No focal liver abnormality.No evidence of biliary obstruction or stone. Pancreas: Unremarkable. Spleen: Unremarkable. Adrenals/Urinary Tract: Right adrenal metastasis which has  enlarged from prior, now 63 mm in maximal diameter, previously 45 mm. No hydronephrosis or stone. Bladder is decompressed by a well-positioned Foley catheter. Reproductive:No acute finding. Stomach/Bowel: Diffusely distended colon with proximal fluid levels no mechanical obstruction seen. No wall thickening. No small bowel obstruction. Vascular/Lymphatic: No acute vascular abnormality. No mass or adenopathy. Peritoneal: No ascites or pneumoperitoneum. Musculoskeletal: Enlarging sacral metastasis which invades the sacral canal and infiltrates the right S1/ S3 and bilateral S2 anterior foramina. Bulky mass centered in the left psoas and intrinsic back muscles and invading the left aspect of the L4 vertebra with body pathologic fracture. There is left L4 nerve root compromise. Review of the MIP images confirms the above findings. IMPRESSION: 1. Negative for pulmonary embolism. 2. Right lower lobe pneumonia or aspiration with small effusion. 3. Stage IV lung cancer with diffuse progression. Pathologic fractures of the L4 vertebral body and right fifth rib. 4. Colonic ileus pattern. 5. Severe, bullous emphysema. Electronically Signed   By: Monte Fantasia M.D.   On: 07/15/2015 13:16      Assessment & Plan: Mr. Dillin Lofgren is a 70 y.o. white male with metastatic lung cancer, hypertension, coronary artery disease, COPD, hyperlipidemia, who was admitted to [facility] on 07/03/2015   1. Acute renal failure with metabolic acidosis: nonoliguric. Baseline creatinine of 1.19 with eGFR >60. IV contrast exposure on 4/20. Solumedrol may be causing elevated BUN.  - renal ultrasound pending.  - started on NS at 81m/hr  2. Acute Respiratory Failure: DNR/DNI. On bipap - gastric tube to be placed to suction  3. Anemia: with pancytopenia. 2 units PRBC ordered. Appreciate hem/onc input.   4. Atrial fibrillation with RVR: amiodarone and diltiazem.    LOS: 3Tignall Rhylen Pulido 4/21/20173:05 PM

## 2015-07-16 NOTE — Progress Notes (Signed)
PT Cancellation Note  Patient Details Name: Mitchell Nguyen MRN: [id number] DOB: Oct 23, 1945   Cancelled Treatment:    Reason Eval/Treat Not Completed: Medical issues which prohibited therapy.  3 attempts to see pt (3 days in a row) but pt with medical issues and not appropriate to see.  Nursing reports pt SOB today at rest and recommending holding PT today.  Per notes, pt with guarded prognosis.  D/t ongoing medical issues complicating ability to work with therapy, will discontinue current PT order (discussed with nursing).  Please re-consult PT when pt is medically appropriate to participate in physical therapy.  Raquel Sarna Onetha Gaffey 07/16/2015, 11:15 AM Leitha Bleak, [place]

## 2015-07-17 ENCOUNTER — Inpatient Hospital Stay: Payer: 59

## 2015-07-17 DIAGNOSIS — R140 Abdominal distension (gaseous): Secondary | ICD-10-CM

## 2015-07-17 LAB — BASIC METABOLIC PANEL
ANION GAP: 13 (ref 5–15)
BUN: 124 mg/dL — AB (ref 6–20)
CO2: 20 mmol/L — ABNORMAL LOW (ref 22–32)
Calcium: 6.4 mg/dL — CL (ref 8.9–10.3)
Chloride: 104 mmol/L (ref 101–111)
Creatinine, Ser: 3.19 mg/dL — ABNORMAL HIGH (ref 0.61–1.24)
GFR calc Af Amer: 22 mL/min — ABNORMAL LOW (ref >60)
GFR, EST NON AFRICAN AMERICAN: 19 mL/min — AB (ref >60)
Glucose, Bld: 164 mg/dL — ABNORMAL HIGH (ref 65–99)
POTASSIUM: 5.1 mmol/L (ref 3.5–5.1)
SODIUM: 137 mmol/L (ref 135–145)

## 2015-07-17 LAB — CBC
HEMATOCRIT: 29.5 % — AB (ref 40.0–52.0)
HEMOGLOBIN: 10 g/dL — AB (ref 13.0–18.0)
MCH: 29.8 pg (ref 26.0–34.0)
MCHC: 34 g/dL (ref 32.0–36.0)
MCV: 87.4 fL (ref 80.0–100.0)
PLATELETS: 34 10*3/uL — AB (ref 150–440)
RBC: 3.37 MIL/uL — AB (ref 4.40–5.90)
RDW: 17.7 % — ABNORMAL HIGH (ref 11.5–14.5)
WBC: 0.7 10*3/uL — CL (ref 3.8–10.6)

## 2015-07-17 LAB — BLOOD GAS, ARTERIAL
ALLENS TEST (PASS/FAIL): POSITIVE — AB
Acid-base deficit: 5.8 mmol/L — ABNORMAL HIGH (ref 0.0–2.0)
Bicarbonate: 19.4 mEq/L — ABNORMAL LOW (ref 21.0–28.0)
FIO2: 0.35
O2 Saturation: 91.4 %
PATIENT TEMPERATURE: 37
PO2 ART: 66 mmHg — AB (ref 83.0–108.0)
pCO2 arterial: 36 mmHg (ref 32.0–48.0)
pH, Arterial: 7.34 — ABNORMAL LOW (ref 7.350–7.450)

## 2015-07-17 LAB — PHOSPHORUS: Phosphorus: 5 mg/dL — ABNORMAL HIGH (ref 2.5–4.6)

## 2015-07-17 LAB — MAGNESIUM: MAGNESIUM: 2.5 mg/dL — AB (ref 1.7–2.4)

## 2015-07-17 MED ORDER — MORPHINE SULFATE (PF) 2 MG/ML IV SOLN
2.0000 mg | INTRAVENOUS | Status: DC | PRN
Start: 2015-07-17 — End: 2015-07-19
  Administered 2015-07-17 (×2): 2 mg via INTRAVENOUS
  Filled 2015-07-17 (×2): qty 1

## 2015-07-17 MED ORDER — LORAZEPAM 2 MG/ML IJ SOLN
1.0000 mg | INTRAMUSCULAR | Status: DC | PRN
  Administered 2015-07-17 (×2): 2 mg via INTRAVENOUS
  Filled 2015-07-17: qty 1

## 2015-07-17 MED ORDER — SCOPOLAMINE 1 MG/3DAYS TD PT72
1.0000 | MEDICATED_PATCH | TRANSDERMAL | Status: DC
  Administered 2015-07-17: 1.5 mg via TRANSDERMAL
  Filled 2015-07-17: qty 1

## 2015-07-17 NOTE — Progress Notes (Signed)
Mitchell Nguyen   DOB:05-30-1945   ZD#:[id number]    History is difficult to assess as patient feels weak/ high flow nasal oxygen.  Subjective:  No acute events overnight; However pt needed to be on BiPAP and high flow nasal oxygen intermittently. Continues to shortness of breath. Continues to feel weak. Complains of abdominal distention- currently has NG tube.  ROS: difficult to assess.   Objective:  Filed Vitals:   07/17/15 1500 07/17/15 1544  BP:  131/56  Pulse: 94 95  Temp:  97.7 F (36.5 C)  Resp: 15 20     Intake/Output Summary (Last 24 hours) at 07/17/15 1807 Last data filed at 07/17/15 1335  Gross per 24 hour  Intake 2152.18 ml  Output    896 ml  Net 1256.18 ml    GENERAL: Sick -appearing Caucasian male patient resting in the bed; mild respiratory distress; High flow nasal oxygen Accompanied by multiple family members including wife sister.  EYES: no pallor or icterus OROPHARYNX: poor dentition. Dry.  NECK: supple, no masses felt LYMPH: no palpable lymphadenopathy in the cervical, axillary or inguinal regions LUNGS: Decreased breath sounds at the right lower base; Bilateral coarse breath sounds.  HEART/CVS: Tachycardic irregularrhythm and no murmurs; No lower extremity edema ABDOMEN: abdomen soft, non-tender and normal bowel sounds Musculoskeletal:no cyanosis of digits and no clubbing  PSYCH: alert & oriented x 3 ; no focal deficits. NEURO: no focal motor/sensory deficits SKIN: no rashes or significant lesions   Labs:  Lab Results  Component Value Date   WBC 0.7* 07/17/2015   HGB 10.0* 07/17/2015   HCT 29.5* 07/17/2015   MCV 87.4 07/17/2015   PLT 34* 07/17/2015   NEUTROABS 0.2* 07/16/2015    Lab Results  Component Value Date   NA 137 07/17/2015   K 5.1 07/17/2015   CL 104 07/17/2015   CO2 20* 07/17/2015    Studies:  US Renal  07/16/2015  CLINICAL DATA:  Acute renal failure EXAM: RENAL / URINARY TRACT ULTRASOUND COMPLETE COMPARISON:   07/15/2015 FINDINGS: Right Kidney: Length: 9.2 cm. Echogenicity within normal limits. No mass or hydronephrosis visualized. Left Kidney: Length: 9.4 cm. There is a 9 mm cyst within the inferior pole. Echogenicity within normal limits. No mass or hydronephrosis visualized. Bladder: Decompressed around a Foley catheter. IMPRESSION: 1. No hydronephrosis. 2. Left renal cyst. Electronically Signed   By: Kerby Moors M.D.   On: 07/16/2015 17:03   Dg Chest Port 1 View  07/17/2015  CLINICAL DATA:  Lung mass. Respiratory failure. History of COPD and emphysema. EXAM: PORTABLE CHEST 1 VIEW COMPARISON:  07/15/2015 FINDINGS: Large anterior lung base mass is stable. There are stable advanced changes of bullous emphysema. No evidence of pneumonia. Small right pleural effusion. No left pleural effusion. No pneumothorax. Right internal jugular Port-A-Cath tip projects in the mid to upper superior vena cava. Oral/nasogastric tube passes below the diaphragm to curl within the stomach. IMPRESSION: 1. No significant change from the previous exam. Persistent small right pleural effusion enlarged right anterior lung base mass. Persistent advanced changes of emphysema. Support apparatus well positioned. 2. No acute findings in the lungs. Electronically Signed   By: Lajean Manes M.D.   On: 07/17/2015 07:32   Dg Abd Portable 1v  07/16/2015  CLINICAL DATA:  Enteric tube placement EXAM: PORTABLE ABDOMEN - 1 VIEW COMPARISON:  07/15/2015 CT abdomen/pelvis FINDINGS: Enteric tube terminates in the gastric fundus. There is gaseous distention of small and large bowel loops in the upper abdomen, with no  evidence of pneumatosis or pneumoperitoneum. Patchy opacity at the right lung base, unchanged. IMPRESSION: Enteric tube terminates in the gastric fundus. Gaseous distention of small and large bowel loops throughout the upper abdomen, suggesting diffuse adynamic ileus. Stable patchy opacity at the right lung base. Electronically Signed   By:  Ilona Sorrel M.D.   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  # Pancytopenia/severe neutropenia white count 0.4/hemoglobin 9.2 platelets 35-Neupogen D#4. S/p 2 units of PRBC transfusion.   #  Acute respiratory failure-  COPD underlying lung cancer/ Pneumonitis. ABG shows- significant hypoxia.   # Acute renal failure creatinine 3.2; worsening. / A. fib currently on Cardizem/amiodarone.   #  Discussed with patient family- son/ and Dr. Mortimer Fries. Significant decline in clinical condition. Recommend comfort measures only. Family agrees.    Cammie Sickle, MD 07/17/2015  6:07 PM

## 2015-07-17 NOTE — Plan of Care (Signed)
Problem: Safety: Goal: Ability to remain free from injury will improve Outcome: Progressing Pt. On bedrest, no complications  Problem: Health Behavior/Discharge Planning: Goal: Ability to manage health-related needs will improve Outcome: Not Progressing Pt. While alert and orientated, weak and requires maximum assist for basic ADL's  Problem: Pain Managment: Goal: General experience of comfort will improve Outcome: Progressing Pt. Tolerating scheduled morphine well, did give a PRN dose of morphine and ativan this AM to assist w/ work of breathing and anxiety. Pt. Able to rest comfortably afterwards  Problem: Physical Regulation: Goal: Ability to maintain clinical measurements within normal limits will improve Outcome: Progressing Pt. VSS while on dilt. Gtt and HFNC @ 35%.  No complications Goal: Will remain free from infection Outcome: Progressing Pt. On neurtopenic precautions, no s/s of infection at this time  Problem: Activity: Goal: Risk for activity intolerance will decrease Outcome: Not Progressing Pt. Weak w/ periods of air hunger, maximum assist for basic ADL's and repositioning  Problem: Fluid Volume: Goal: Ability to maintain a balanced intake and output will improve Outcome: Not Progressing Pt. UOP @ on average 7m/h O/N  Problem: Nutrition: Goal: Adequate nutrition will be maintained Outcome: Not Progressing Pt. NPO r/t abd distention & confirmed ileus.  NGT to CLWS total output > 500 O/N  Problem: Bowel/Gastric: Goal: Will not experience complications related to bowel motility Outcome: Not Progressing Pt. Has confirmed ileus, given ducolax suppository PRN, no BM O/N LBM: 4/20

## 2015-07-17 NOTE — Progress Notes (Signed)
After further discussion with all family members including Wife and all sons, the patient has expressed to family that he is ready to go. The family has decided to proceed and consented to Comfort Care Measures.  I have explained the dying process and will proceed to transfer to large room on 1C   Patient/Family are satisfied with Plan of action and management. All questions answered  Corrin Parker, M.D.  [facility]  Medical Director [place] Director [facility] Cardio-Pulmonary Department

## 2015-07-17 NOTE — Progress Notes (Signed)
PULMONARY / CRITICAL CARE MEDICINE   Name: Mitchell Nguyen MRN: [id number] DOB: 1945/05/20    ADMISSION DATE:  07/22/2015  BRIEF HISTORY: 70 year old male past medical history of stage IV non-small cell lung cancer with metastases , currently on palliative chemotherapy, DO NOT RESUSCITATE/DNI, seen in consultation for respiratory distress after cardiac catheterization.   SUBJECTIVE:  Patient with unstable respiratory status overnight, had abdominal pain. CT A/P with mild colonic illeus.  Increased WOB, lethargic,using accessory muscles to breathe. Ng to suction ABG pending, creatinine worsening   VITAL SIGNS: Temp:  [97.3 F (36.3 C)-98.4 F (36.9 C)] 97.9 F (36.6 C) (04/22 0200) Pulse Rate:  [83-99] 84 (04/22 0600) Resp:  [13-25] 13 (04/22 0600) BP: (83-140)/(56-97) 120/58 mmHg (04/22 0600) SpO2:  [90 %-98 %] 94 % (04/22 0600) FiO2 (%):  [35 %] 35 % (04/22 0214) Weight:  [142 lb 13.7 oz (64.8 kg)] 142 lb 13.7 oz (64.8 kg) (04/22 0417) HEMODYNAMICS:   VENTILATOR SETTINGS: Vent Mode:  [-]  FiO2 (%):  [35 %] 35 % INTAKE / OUTPUT:  Intake/Output Summary (Last 24 hours) at 07/17/15 0758 Last data filed at 07/17/15 6967  Gross per 24 hour  Intake 1962.59 ml  Output    971 ml  Net 991.59 ml    Review of Systems  Unable to perform ROS: critical illness  Constitutional: Positive for malaise/fatigue and diaphoresis.  Respiratory: Positive for shortness of breath.   Skin: Negative for rash.  Neurological: Positive for weakness. Negative for headaches.    Physical Exam  Constitutional: He appears distressed.  HENT:  Head: Normocephalic.  Right Ear: External ear normal.  Left Ear: External ear normal.  Neck: Neck supple.  Cardiovascular: Intact distal pulses.   Irregular   Pulmonary/Chest: He is in respiratory distress. He has wheezes. He has rales.  On HFNC, faint expiratory wheezes  Abdominal: Bowel sounds are normal. He exhibits distension. There is  tenderness. There is guarding. There is no rebound.  Musculoskeletal: Normal range of motion. He exhibits no edema.  Neurological:  lethargic  Skin: Skin is warm. He is diaphoretic.  Nursing note and vitals reviewed.    LABS:  CBC  Recent Labs Lab 07/15/15 0844 07/16/15 0529 07/16/15 2008 07/17/15 0410  WBC 0.3* 0.4*  --  0.7*  HGB 7.6* 7.7* 10.3* 10.0*  HCT 22.8* 23.4* 30.4* 29.5*  PLT 41* 35*  --  34*   Coag's  Recent Labs Lab 06/30/2015 1122 07/11/2015 1414  APTT 35 65*  INR 1.15 1.51   BMET  Recent Labs Lab 07/15/15 0844 07/16/15 0529 07/17/15 0410  NA 134* 138 137  K 4.7 5.2* 5.1  CL 104 106 104  CO2 19* 18* 20*  BUN 76* 94* 124*  CREATININE 1.58* 2.31* 3.19*  GLUCOSE 178* 189* 164*   Electrolytes  Recent Labs Lab 07/14/15 0505 07/15/15 0844 07/16/15 0529 07/17/15 0410  CALCIUM 6.6* 6.8* 6.7* 6.4*  MG 1.9  --   --  2.5*  PHOS 3.9  --   --  5.0*   Sepsis Markers  Recent Labs Lab 07/05/2015 1122  LATICACIDVEN 1.6   ABG  Recent Labs Lab 07/14/15 0442  PHART 7.38  PCO2ART 32  PO2ART 87   Liver Enzymes No results for input(s): AST, ALT, ALKPHOS, BILITOT, ALBUMIN in the last 168 hours. Cardiac Enzymes  Recent Labs Lab 07/14/2015 1122  TROPONINI 0.11*   Glucose  Recent Labs Lab 07/17/2015 1320  GLUCAP 243*    Imaging US Renal  07/16/2015  CLINICAL DATA:  Acute renal failure EXAM: RENAL / URINARY TRACT ULTRASOUND COMPLETE COMPARISON:  07/15/2015 FINDINGS: Right Kidney: Length: 9.2 cm. Echogenicity within normal limits. No mass or hydronephrosis visualized. Left Kidney: Length: 9.4 cm. There is a 9 mm cyst within the inferior pole. Echogenicity within normal limits. No mass or hydronephrosis visualized. Bladder: Decompressed around a Foley catheter. IMPRESSION: 1. No hydronephrosis. 2. Left renal cyst. Electronically Signed   By: Kerby Moors M.D.   On: 07/16/2015 17:03   Dg Chest Port 1 View  07/17/2015  CLINICAL DATA:  Lung mass.  Respiratory failure. History of COPD and emphysema. EXAM: PORTABLE CHEST 1 VIEW COMPARISON:  07/15/2015 FINDINGS: Large anterior lung base mass is stable. There are stable advanced changes of bullous emphysema. No evidence of pneumonia. Small right pleural effusion. No left pleural effusion. No pneumothorax. Right internal jugular Port-A-Cath tip projects in the mid to upper superior vena cava. Oral/nasogastric tube passes below the diaphragm to curl within the stomach. IMPRESSION: 1. No significant change from the previous exam. Persistent small right pleural effusion enlarged right anterior lung base mass. Persistent advanced changes of emphysema. Support apparatus well positioned. 2. No acute findings in the lungs. Electronically Signed   By: Lajean Manes M.D.   On: 07/17/2015 07:32   Dg Abd Portable 1v  07/16/2015  CLINICAL DATA:  Enteric tube placement EXAM: PORTABLE ABDOMEN - 1 VIEW COMPARISON:  07/15/2015 CT abdomen/pelvis FINDINGS: Enteric tube terminates in the gastric fundus. There is gaseous distention of small and large bowel loops in the upper abdomen, with no evidence of pneumatosis or pneumoperitoneum. Patchy opacity at the right lung base, unchanged. IMPRESSION: Enteric tube terminates in the gastric fundus. Gaseous distention of small and large bowel loops throughout the upper abdomen, suggesting diffuse adynamic ileus. Stable patchy opacity at the right lung base. Electronically Signed   By: Ilona Sorrel M.D.   On: 07/16/2015 18:02    STUDIES:   07/08/2015 Left heart cath indicative of Mid RCA lesion, 25% stenosed. The lesion was previously treated with a stent (unknown type).Dist LAD lesion, 15% stenosed. The lesion was previously treated with a stent (unknown type) greater than two years ago.Normal LVF EF=55%No significant CAD Widely patent stents CTA Chest 4/20 ->no pe, RLL PNA CT a/p 4/20>>colonic ileus,pathologic L4 vertebral body and right fifth rib fx  CULTURES: 4/18 BC>> 4/18  UC>> 4/18 [place]>> ANTIBIOTICS: 4/18 Vancomycin>> 4/18 Zosyn>>  SIGNIFICANT EVENTS: none  LINES/TUBES: none  DISCUSSION: 70 yo male with Hypertension, hyperlipidemia, previous MI X 2, Tobaco abuse, lung mass, now presenting with STEMI and PNA. Patient had a left heat cath and was transferred to ICU on BiPAP for further management. Patient with acute resp failure from pneumonia with colonic ileus with progressive renal failure in the setting of end stage lung cancer with mets  ASSESSMENT / PLAN:  PULMONARY A: Acute on chronic Hypoxemic respiratory failure - improving COPD PNA Lung mass - Stage IV NSCLC with brain mets P:  Continue HFNC To keep sats>88% ABG pending CXR prn Vanc/ zosyn for HCAP Followed by oncology as an outpatient Methyl prednisone 60 mg twice and then taper Albuterol/pulmicort/atrovent Incentive spirometry, flutter valve.  CTA chest with no PE.   CARDIOVASCULAR A:  Hx of Hypertension, hyperlipidemia New onset Afib P:  - given amiodarone bolus, started on digoxin - cards following  RENAL A:  Acute Kidney injury Hyponatremia,Hyperkalemia - improving Dehydration - start NS'@50cc'$ /hr, this should assist with kidney fch Poor Appetite -worsening renal status  P:  Replace electrolytes. Follow chemistry  GASTROINTESTINAL A:  Possible colonic ileus P:  On soft diet Protonix for GI prophylaxis BM regiment CT a/p with possible colonic ileus - had flatus overnight  HEMATOLOGIC A:  Stage IV Lung Ca with mets L4 vertebral Fx Right 5th fx Palliative Chemo - now with nadir (low counts) P:  Heparin for DVT prophylaxis Pain management - fentanyl patch 69mg, schedule percocet for fractures (these are new)/  2u PRBC today  INFECTIOUS A:  HCAP P:  Follow cultures Antibiotics as above Follow CBC in am  ENDOCRINE A:  No active issues P:  blood sugar checks intermittently  NEUROLOGIC A:  No active issues P:   encephlopathy-obtain ABG  CODE STATUS: DNR/DNI  SOCIAL - family updated on clinical status - family requested information on Hospice, social worker consulted.   I have personally obtained a history, examined the patient, evaluated Pertinent laboratory and [employer]/imaging results, and  formulated the assessment and plan   The Patient requires high complexity decision making for assessment and support, frequent evaluation and titration of therapies, application of advanced monitoring technologies and extensive interpretation of multiple databases. Critical Care Time devoted to patient care services described in this note is 35 minutes.   Overall, patient is critically ill, prognosis is guarded.  Patient with Multiorgan failure and at high risk for cardiac arrest and death.   After further assessment, I would recommend comfort care measures.Palliative care team Consulted   Shaena Parkerson DPatricia Pesa M.D.  [facility]  Medical Director [place]Director [facility]

## 2015-07-17 NOTE — Progress Notes (Signed)
Pharmacy Antibiotic Note  Mitchell Nguyen is a 70 y.o. male admitted on 06/26/2015 with pneumonia and sepsis.  Pharmacy has been consulted for Zosyn dosing.  Plan: Creatinine continues to increase; therefore will dose as if patient's Crcl <10 ml/min since Crcl can't accurately measure true renal function in AKI.  Continue Zosyn 3.375 g IV q12 hours.   Height: 6' (182.9 cm) Weight: 142 lb 13.7 oz (64.8 kg) IBW/kg (Calculated) : 77.6  Temp (24hrs), Avg:97.6 F (36.4 C), Min:97.3 F (36.3 C), Max:98.4 F (36.9 C)   Recent Labs Lab 07/07/2015 1122 07/14/2015 1414 07/14/15 0505 07/15/15 0844 07/16/15 0529 07/17/15 0410  WBC 2.2* 1.9* 0.6* 0.3* 0.4* 0.7*  CREATININE 1.41* 1.35* 1.44* 1.58* 2.31* 3.19*  LATICACIDVEN 1.6  --   --   --   --   --   VANCOTROUGH  --  30*  --   --   --   --     Estimated Creatinine Clearance: 20.6 mL/min (by C-G formula based on Cr of 3.19).    Allergies  Allergen Reactions  . Lisinopril Anaphylaxis    Antimicrobials this admission: Vancomycin 4/18>> 4/19 Zosyn 4/18  >>   Dose adjustments this admission: Zosyn 3.375 g IV q12 hours.   Microbiology results: BCx: NGTD UCx:  NGTD Sputum:  pending MRSA PCR: NG  Thank you for allowing pharmacy to be a part of this patient's care.  Hevin Jeffcoat C 07/17/2015 9:29 AM

## 2015-07-17 NOTE — Progress Notes (Signed)
RT removed patient from HFNC per order from Dr. Mortimer Fries for comfort care, and placed on 3LPM [place], patient received morphine prior to removing HFNC and appears comfortable at this time.

## 2015-07-17 NOTE — Progress Notes (Signed)
[place] [facility] [place] NAME: Mitchell Nguyen    MR#:  [id number]  DATE OF BIRTH:  1945-11-21  SUBJECTIVE:  Patient currently is struggling to breathe. He is on high flow nasal Oxygen. Opens eyes to verbal commands. 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 - Remained on BiPAP, now on high flow at 36% fio2 and 45% flow rate. - very congested cough, worsening renal function, still leukopenic- received 2 units prbc yesterday, received neupogen - on cardizem drip for afib,  - abdominal distention present. -Poor urine output. Elevated creatinine. REVIEW OF SYSTEMS:  Review of Systems  Constitutional: Positive for malaise/fatigue. Negative for fever and chills.  HENT: Negative for congestion, ear discharge, ear pain and nosebleeds.   Eyes: Negative for blurred vision and double vision.  Respiratory: Positive for cough and sputum production. Negative for shortness of breath and wheezing.   Cardiovascular: Negative for chest pain, palpitations and leg swelling.  Gastrointestinal: Positive for nausea, abdominal pain and constipation. Negative for heartburn, vomiting and diarrhea.  Genitourinary: Negative for dysuria.  Musculoskeletal: Positive for myalgias and back pain.  Neurological: Negative for dizziness, tingling, tremors, seizures and headaches.  Psychiatric/Behavioral: Negative for depression.    DRUG ALLERGIES:   Allergies  Allergen Reactions  . Lisinopril Anaphylaxis    VITALS:  Blood pressure 120/58, pulse 84, temperature 97.9 F (36.6 C), temperature source Oral, resp. rate 13, height 6' (1.829 m), weight 64.8 kg (142 lb 13.7 oz), SpO2 94 %.  PHYSICAL EXAMINATION:  Physical Exam  GENERAL:  70 y.o.-year-old critically ill  patient lying in the bed with acute Ongoing respiratory distress.  EYES: Pupils equal, round, reactive to light and accommodation. No scleral icterus. Extraocular muscles intact.  HEENT: Head atraumatic, normocephalic. Oropharynx and nasopharynx clear. Dry oral mucosa NECK:  Supple, no jugular venous distention. No thyroid enlargement, no tenderness.  LUNGS: coarse breath sounds bilaterally, no wheezing, rales or crepitation. No use of accessory muscles of respiration.  CARDIOVASCULAR: S1, S2 regular now, No murmurs, rubs, or gallops. Tachycardia ABDOMEN: firm and distended, hypoactive bowel sounds on exam.  EXTREMITIES: No pedal edema, cyanosis, or clubbing.  NEUROLOGIC: Cranial nerves II through XII are intact. Muscle strength 3/5 in all extremities. Sensation intact. Gait not checked. Global weakness noted. PSYCHIATRIC: The patient is alert but very sleepy and unable to hold a conversation due to shortness of breath.  SKIN: No obvious rash, lesion, or ulcer.    LABORATORY PANEL:   CBC  Recent Labs Lab 07/17/15 0410  WBC 0.7*  HGB 10.0*  HCT 29.5*  PLT 34*   ------------------------------------------------------------------------------------------------------------------  Chemistries   Recent Labs Lab 07/17/15 0410  NA 137  K 5.1  CL 104  CO2 20*  GLUCOSE 164*  BUN 124*  CREATININE 3.19*  CALCIUM 6.4*  MG 2.5*   ------------------------------------------------------------------------------------------------------------------  Cardiac Enzymes  Recent Labs Lab 07/18/2015 1122  TROPONINI 0.11*   ------------------------------------------------------------------------------------------------------------------  RADIOLOGY:  Ct Abdomen Pelvis Wo Contrast  07/15/2015  CLINICAL DATA:  Stage IV lung cancer with metastatic pain. Shortness of breath and abdominal pain EXAM: CT ANGIOGRAPHY CHEST CT ABDOMEN AND PELVIS WITHOUT CONTRAST TECHNIQUE: CT OF THE ABDOMEN WAS  PERFORMED WITHOUT CONTRAST. Multidetector CT imaging of the chest was performed using the standard protocol during bolus administration of intravenous contrast.  Multiplanar CT image reconstructions and MIPs were obtained to evaluate the vascular anatomy. CONTRAST:  70 cc Isovue 370 intravenous COMPARISON:  PET-CT 06/04/2015 FINDINGS: CTA CHEST FINDINGS THORACIC INLET/BODY WALL: Porta catheter on the right with tip in good position. MEDIASTINUM: Normal heart size. Small pericardial effusion which is similar to February 2017. No superimposed nodularity. Patient's right middle lobe cancer exerts moderate mass effect on the atrium of the right lateral ventricle. Negative for acute pulmonary embolism. No acute arterial finding. Atherosclerosis with left coronary stents. LUNG WINDOWS: Severe emphysema, with bullous changes in the apical lungs. Right middle lobe is distended by a poorly enhancing 12 cm malignancy. There is airspace disease in the right lower lobe which is new from prior compatible with pneumonia. Small layering right pleural effusion OSSEOUS: Right posterior and lateral fifth rib fracture metastasis with fracture that is essentially nondisplaced. The metastasis and fracture have progressed/developed since PET CT comparison CT ABDOMEN and PELVIS FINDINGS Abdominal wall:  No contributory findings. Hepatobiliary: No focal liver abnormality.No evidence of biliary obstruction or stone. Pancreas: Unremarkable. Spleen: Unremarkable. 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 No hydronephrosis or stone. Bladder is decompressed by a well-positioned Foley catheter. Reproductive:No acute finding. Stomach/Bowel: Diffusely distended colon with proximal fluid levels no mechanical obstruction seen. No wall thickening. No small bowel obstruction. Vascular/Lymphatic: No acute vascular abnormality. No mass or adenopathy. Peritoneal: No  ascites or pneumoperitoneum. Musculoskeletal: Enlarging sacral metastasis which invades the sacral canal and infiltrates the right S1/ S3 and bilateral S2 anterior foramina. Bulky mass centered in the left psoas and intrinsic back muscles and invading the left aspect of the L4 vertebra with body pathologic fracture. There is left L4 nerve root compromise. Review of the MIP images confirms the above findings. IMPRESSION: 1. Negative for pulmonary embolism. 2. Right lower lobe pneumonia or aspiration with small effusion. 3. Stage IV lung cancer with diffuse progression. Pathologic fractures of the L4 vertebral body and right fifth rib. 4. Colonic ileus pattern. 5. Severe, bullous emphysema. Electronically Signed   By: Monte Fantasia M.D.   On: 07/15/2015 13:16   Ct Angio Chest Pe W/cm &/or Wo Cm  07/15/2015  CLINICAL DATA:  Stage IV lung cancer with metastatic pain. Shortness of breath and abdominal pain EXAM: CT ANGIOGRAPHY CHEST CT ABDOMEN AND PELVIS WITHOUT CONTRAST TECHNIQUE: CT OF THE ABDOMEN WAS PERFORMED WITHOUT CONTRAST. Multidetector CT imaging of the chest was performed using the standard protocol during bolus administration of intravenous contrast. Multiplanar CT image reconstructions and MIPs were obtained to evaluate the vascular anatomy. CONTRAST:  70 cc Isovue 370 intravenous COMPARISON:  PET-CT 06/04/2015 FINDINGS: CTA CHEST FINDINGS THORACIC INLET/BODY WALL: Porta catheter on the right with tip in good position. MEDIASTINUM: Normal heart size. Small pericardial effusion which is similar to February 2017. No superimposed nodularity. Patient's right middle lobe cancer exerts moderate mass effect on the atrium of the right lateral ventricle. Negative for acute pulmonary embolism. No acute arterial finding. Atherosclerosis with left coronary stents. LUNG WINDOWS: Severe emphysema, with bullous changes in the apical lungs. Right middle lobe is distended by a poorly enhancing 12 cm malignancy. There is  airspace disease in the right lower lobe which is new from prior compatible with pneumonia. Small layering right pleural effusion OSSEOUS: Right posterior and lateral fifth rib fracture metastasis with fracture that is essentially nondisplaced. The metastasis and fracture have progressed/developed since PET CT comparison CT ABDOMEN and PELVIS FINDINGS Abdominal  wall:  No contributory findings. Hepatobiliary: No focal liver abnormality.No evidence of biliary obstruction or stone. Pancreas: Unremarkable. Spleen: Unremarkable. 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 No hydronephrosis or stone. Bladder is decompressed by a well-positioned Foley catheter. Reproductive:No acute finding. Stomach/Bowel: Diffusely distended colon with proximal fluid levels no mechanical obstruction seen. No wall thickening. No small bowel obstruction. Vascular/Lymphatic: No acute vascular abnormality. No mass or adenopathy. Peritoneal: No ascites or pneumoperitoneum. Musculoskeletal: Enlarging sacral metastasis which invades the sacral canal and infiltrates the right S1/ S3 and bilateral S2 anterior foramina. Bulky mass centered in the left psoas and intrinsic back muscles and invading the left aspect of the L4 vertebra with body pathologic fracture. There is left L4 nerve root compromise. Review of the MIP images confirms the above findings. IMPRESSION: 1. Negative for pulmonary embolism. 2. Right lower lobe pneumonia or aspiration with small effusion. 3. Stage IV lung cancer with diffuse progression. Pathologic fractures of the L4 vertebral body and right fifth rib. 4. Colonic ileus pattern. 5. Severe, bullous emphysema. Electronically Signed   By: Monte Fantasia M.D.   On: 07/15/2015 13:16   US Renal  07/16/2015  CLINICAL DATA:  Acute renal failure EXAM: RENAL / URINARY TRACT ULTRASOUND COMPLETE COMPARISON:  07/15/2015 FINDINGS: Right Kidney:  Length: 9.2 cm. Echogenicity within normal limits. No mass or hydronephrosis visualized. Left Kidney: Length: 9.4 cm. There is a 9 mm cyst within the inferior pole. Echogenicity within normal limits. No mass or hydronephrosis visualized. Bladder: Decompressed around a Foley catheter. IMPRESSION: 1. No hydronephrosis. 2. Left renal cyst. Electronically Signed   By: Kerby Moors M.D.   On: 07/16/2015 17:03   Dg Chest Port 1 View  07/17/2015  CLINICAL DATA:  Lung mass. Respiratory failure. History of COPD and emphysema. EXAM: PORTABLE CHEST 1 VIEW COMPARISON:  07/15/2015 FINDINGS: Large anterior lung base mass is stable. There are stable advanced changes of bullous emphysema. No evidence of pneumonia. Small right pleural effusion. No left pleural effusion. No pneumothorax. Right internal jugular Port-A-Cath tip projects in the mid to upper superior vena cava. Oral/nasogastric tube passes below the diaphragm to curl within the stomach. IMPRESSION: 1. No significant change from the previous exam. Persistent small right pleural effusion enlarged right anterior lung base mass. Persistent advanced changes of emphysema. Support apparatus well positioned. 2. No acute findings in the lungs. Electronically Signed   By: Lajean Manes M.D.   On: 07/17/2015 07:32   Dg Abd Portable 1v  07/16/2015  CLINICAL DATA:  Enteric tube placement EXAM: PORTABLE ABDOMEN - 1 VIEW COMPARISON:  07/15/2015 CT abdomen/pelvis FINDINGS: Enteric tube terminates in the gastric fundus. There is gaseous distention of small and large bowel loops in the upper abdomen, with no evidence of pneumatosis or pneumoperitoneum. Patchy opacity at the right lung base, unchanged. IMPRESSION: Enteric tube terminates in the gastric fundus. Gaseous distention of small and large bowel loops throughout the upper abdomen, suggesting diffuse adynamic ileus. Stable patchy opacity at the right lung base. Electronically Signed   By: Ilona Sorrel M.D.   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  #1 Acute hypoxic respiratory failure- secondary to pneumonitis and also healthcare acquired pneumonia-requiring BiPAP at bedtime. Now changed over to high flow nasal  cannula. -Appreciate pulmonary consult. -Continue steroids and taper - On zosyn -Encouraged flutter valve and if needed we can do percussion chest therapy -CT chest negative for any pulmonary embolism -Continue inhalers. Patient has morphine for respiratory distress. It appears he is struggling to breathe are present.   #2 Afib with rvr- appreciate cardiology consult - currently on cardizem drip -Loaded with digoxin and on daily digoxin now -On admission due to EKG changes and also elevated troponin, STEMI was suspected and patient underwent cardiac catheterization that did not reveal worsening active disease  #3 Pancytopenia-continue to recent chemotherapy. Received Neupogen. White count is still low at 0.4. - Hemoglobin dropped steadily decreased to 7.7, receiving 2 units packed RBC transfusion yesterday -Platelets also at 35K, no transfusion indicated unless less than 20 K or any active bleeding suspected -Appreciate oncology consult  #4 Metastatic squamous cell cell lung cancer-lung cancer with spread to bones and also small brain metastases noted. -Received first cycle of carboplatin and Taxol - complications with pancytopenia - received radiation for his back mets - guarded prognosis at this point. Patient is DNR  #5 ARF- ? ATN  - Seen by nephrology. Patient has worsening in his creatinine. It is not making much urine. Started on normal saline by nephrology. Baseline creatinine 1.19. Creatinine today is 3.19.  - renal function progressively worsening.   #6 Abdominal distention- ? Ileus on CT  abd - laxatives started. monitor  #7 DVT Prophylaxis- TEDs and SCDs. No heparin products due to anemia and thrombocytopenia.   Patient overall carries a very poor prognosis given his multitude of medical problems. Palliative care consultation appreciated. Patient is a no code DO NOT RESUSCITATE. Since patient is on high flow nasal cannula cannot consider hospice home. Dr. Raliegh Ip ASA to discuss with family today. Patient has multiorgan failure.   All the records are reviewed and case discussed with Care Management/Social Workerr. Management plans discussed with the patient, family and they are in agreement.  CODE STATUS: DNR  TOTAL CRITICAL CARE TIME SPENT IN TAKING  CARE OF THIS PATIENT: 40 minutes.   Guarded prognosis overall. Discussed with Dr. Marcelyn Bruins M.D on 07/17/2015 at 7:51 AM  Between 7am to 6pm - Pager - [phone] After 6pm go to [email or web address] - password [email or web address] [facility]  [facility]  Office  [phone]  CC: Primary care physician; No PCP Per Patient

## 2015-07-18 DIAGNOSIS — F419 Anxiety disorder, unspecified: Secondary | ICD-10-CM

## 2015-07-18 LAB — CULTURE, BLOOD (ROUTINE X 2)
CULTURE: NO GROWTH
Culture: NO GROWTH

## 2015-07-18 LAB — TYPE AND SCREEN
ABO/RH(D): B POS
Antibody Screen: NEGATIVE
Unit division: 0
Unit division: 0

## 2015-07-18 LAB — GLUCOSE, CAPILLARY: Glucose-Capillary: 123 mg/dL — ABNORMAL HIGH (ref 65–99)

## 2015-07-18 MED ORDER — GLYCOPYRROLATE 0.2 MG/ML IJ SOLN
0.2000 mg | INTRAMUSCULAR | Status: DC
  Administered 2015-07-18 – 2015-07-19 (×8): 0.2 mg via INTRAVENOUS
  Filled 2015-07-18 (×9): qty 1

## 2015-07-18 NOTE — Progress Notes (Signed)
[place] [facility] [place] NAME: Khyre Germond    MR#:  [id number]  DATE OF BIRTH:  1946/01/03  SUBJECTIVE:  Family in the room. Pt under comfort care  REVIEW OF SYSTEMS:  Review of Systems  Unable to perform ROS: medical condition   DRUG ALLERGIES:   Allergies  Allergen Reactions  . Lisinopril Anaphylaxis    VITALS:  Blood pressure 130/74, pulse 102, temperature 97.6 F (36.4 C), temperature source Oral, resp. rate 18, height 6' (1.829 m), weight 64.8 kg (142 lb 13.7 oz), SpO2 93 %.  PHYSICAL EXAMINATION:  Physical Exam  GENERAL:  70 y.o.-year-old critically ill patient lying in the bed with acute Ongoing respiratory distress.  EYES: Pupils equal, round, reactive to light and accommodation. No scleral icterus. Extraocular muscles intact.  HEENT: Head atraumatic, normocephalic. Oropharynx and nasopharynx clear. Dry oral mucosa NECK:  Supple, no jugular venous distention. No thyroid enlargement, no tenderness.  LUNGS: coarse breath sounds bilaterally, no wheezing, rales or crepitation. No use of accessory muscles of respiration.  CARDIOVASCULAR: S1, S2 regular now, No murmurs, rubs, or gallops. Tachycardia ABDOMEN: firm and distended, hypoactive bowel sounds on exam.  EXTREMITIES: No pedal edema, cyanosis, or clubbing.  NEUROLOGIC: Unable to assess. Patient under comfort measures  PSYCHIATRIC: Sleepy. Comfort measures  SKIN: No obvious rash, lesion, or ulcer.    LABORATORY PANEL:   CBC  Recent Labs Lab 07/17/15 0410  WBC 0.7*  HGB 10.0*  HCT 29.5*  PLT 34*   ------------------------------------------------------------------------------------------------------------------  Chemistries   Recent Labs Lab 07/17/15 0410  NA 137  K 5.1  CL 104  CO2 20*  GLUCOSE 164*  BUN 124*  CREATININE 3.19*  CALCIUM 6.4*  MG 2.5*    ------------------------------------------------------------------------------------------------------------------  Cardiac Enzymes  Recent Labs Lab 07/22/2015 1122  TROPONINI 0.11*   ------------------------------------------------------------------------------------------------------------------  RADIOLOGY:  US Renal  07/16/2015  CLINICAL DATA:  Acute renal failure EXAM: RENAL / URINARY TRACT ULTRASOUND COMPLETE COMPARISON:  07/15/2015 FINDINGS: Right Kidney: Length: 9.2 cm. Echogenicity within normal limits. No mass or hydronephrosis visualized. Left Kidney: Length: 9.4 cm. There is a 9 mm cyst within the inferior pole. Echogenicity within normal limits. No mass or hydronephrosis visualized. Bladder: Decompressed around a Foley catheter. IMPRESSION: 1. No hydronephrosis. 2. Left renal cyst. Electronically Signed   By: Kerby Moors M.D.   On: 07/16/2015 17:03   Dg Chest Port 1 View  07/17/2015  CLINICAL DATA:  Lung mass. Respiratory failure. History of COPD and emphysema. EXAM: PORTABLE CHEST 1 VIEW COMPARISON:  07/15/2015 FINDINGS: Large anterior lung base mass is stable. There are stable advanced changes of bullous emphysema. No evidence of pneumonia. Small right pleural effusion. No left pleural effusion. No pneumothorax. Right internal jugular Port-A-Cath tip projects in the mid to upper superior vena cava. Oral/nasogastric tube passes below the diaphragm to curl within the stomach. IMPRESSION: 1. No significant change from the previous exam. Persistent small right pleural effusion enlarged right anterior lung base mass. Persistent advanced changes of emphysema. Support apparatus well positioned. 2. No acute findings in the lungs. Electronically Signed   By: Lajean Manes M.D.   On: 07/17/2015 07:32   Dg Abd Portable 1v  07/16/2015  CLINICAL DATA:  Enteric tube placement EXAM: PORTABLE ABDOMEN - 1 VIEW COMPARISON:  07/15/2015 CT abdomen/pelvis FINDINGS: Enteric tube terminates in the  gastric fundus. There is gaseous distention of small and large bowel loops in the upper abdomen, with no evidence of pneumatosis or pneumoperitoneum.  Patchy opacity at the right lung base, unchanged. IMPRESSION: Enteric tube terminates in the gastric fundus. Gaseous distention of small and large bowel loops throughout the upper abdomen, suggesting diffuse adynamic ileus. Stable patchy opacity at the right lung base. Electronically Signed   By: Ilona Sorrel M.D.   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  #1 Acute hypoxic respiratory failure- secondary to pneumonitis and also healthcare acquired pneumonia-requiring BiPAP at bedtime. Now changed over to high flow nasal cannula. #2 Afib with rvr #3 Pancytopenia #4 Metastatic squamous cell cell lung cancer-lung cancer with spread to bones and also small brain metastases noted. #5 ARF- ? ATN  - Seen by nephrology. Patient has worsening in his creatinine. It is not making much urine. #6 Abdominal distention- ? Ileus on CT abd #7 DVT Prophylaxis- TEDs and SCDs. No heparin products due to anemia and thrombocytopenia.  Patient is now comfort measures. Family in the room.  Management plans discussed with the family and they are in agreement.  CODE STATUS: DNR  TOTAL CRITICAL CARE TIME SPENT IN TAKING  CARE OF THIS PATIENT: 20 minutes.   Guarded prognosis overall.  Cleven Jansma M.D on 07/18/2015 at 11:07 AM  Between 7am to 6pm - Pager - [phone] After 6pm go to [email or web address] - password [email or web address] [facility]  [facility]  Office  [phone]  CC: Primary care physician; No PCP Per Patient

## 2015-07-18 NOTE — Progress Notes (Signed)
Mitchell Nguyen   DOB:13-Oct-1945   KK#:[id number]    History is difficult to assess as patient feels weak; drowsy/ high flow nasal oxygen. Family by the bedside. Patient is on comfort measures only  Subjective:  As per the family patient barely communicating. Patient getting morphine as needed for respiratory distress/anxiety.   ROS: difficult to assess.   Objective:  Filed Vitals:   07/17/15 1544 07/18/15 0519  BP: 131/56 130/74  Pulse: 95 102  Temp: 97.7 F (36.5 C) 97.6 F (36.4 C)  Resp: 20 18     Intake/Output Summary (Last 24 hours) at 07/18/15 1128 Last data filed at 07/18/15 7169  Gross per 24 hour  Intake 169.59 ml  Output   1275 ml  Net -1105.41 ml    GENERAL: Sick -appearing Caucasian male patient resting in the bed; mild respiratory distress; High flow nasal oxygen Accompanied by multiple family members including wife sister.  EYES: no pallor or icterus OROPHARYNX: poor dentition. Dry.  NECK: supple, no masses felt LUNGS: Decreased breath sounds at the right lower base; Bilateral coarse breath sounds.  HEART/CVS: Tachycardic irregularrhythm and no murmurs; No lower extremity edema ABDOMEN: abdomen soft, non-tender and normal bowel sounds Musculoskeletal:no cyanosis of digits and no clubbing  NEURO: Drowsy/ further neurologic exam not be done SKIN: no rashes or significant lesions   Labs:  Lab Results  Component Value Date   WBC 0.7* 07/17/2015   HGB 10.0* 07/17/2015   HCT 29.5* 07/17/2015   MCV 87.4 07/17/2015   PLT 34* 07/17/2015   NEUTROABS 0.2* 07/16/2015    Lab Results  Component Value Date   NA 137 07/17/2015   K 5.1 07/17/2015   CL 104 07/17/2015   CO2 20* 07/17/2015    Studies:  US Renal  07/16/2015  CLINICAL DATA:  Acute renal failure EXAM: RENAL / URINARY TRACT ULTRASOUND COMPLETE COMPARISON:  07/15/2015 FINDINGS: Right Kidney: Length: 9.2 cm. Echogenicity within normal limits. No mass or hydronephrosis visualized. Left Kidney:  Length: 9.4 cm. There is a 9 mm cyst within the inferior pole. Echogenicity within normal limits. No mass or hydronephrosis visualized. Bladder: Decompressed around a Foley catheter. IMPRESSION: 1. No hydronephrosis. 2. Left renal cyst. Electronically Signed   By: Kerby Moors M.D.   On: 07/16/2015 17:03   Dg Chest Port 1 View  07/17/2015  CLINICAL DATA:  Lung mass. Respiratory failure. History of COPD and emphysema. EXAM: PORTABLE CHEST 1 VIEW COMPARISON:  07/15/2015 FINDINGS: Large anterior lung base mass is stable. There are stable advanced changes of bullous emphysema. No evidence of pneumonia. Small right pleural effusion. No left pleural effusion. No pneumothorax. Right internal jugular Port-A-Cath tip projects in the mid to upper superior vena cava. Oral/nasogastric tube passes below the diaphragm to curl within the stomach. IMPRESSION: 1. No significant change from the previous exam. Persistent small right pleural effusion enlarged right anterior lung base mass. Persistent advanced changes of emphysema. Support apparatus well positioned. 2. No acute findings in the lungs. Electronically Signed   By: Lajean Manes M.D.   On: 07/17/2015 07:32   Dg Abd Portable 1v  07/16/2015  CLINICAL DATA:  Enteric tube placement EXAM: PORTABLE ABDOMEN - 1 VIEW COMPARISON:  07/15/2015 CT abdomen/pelvis FINDINGS: Enteric tube terminates in the gastric fundus. There is gaseous distention of small and large bowel loops in the upper abdomen, with no evidence of pneumatosis or pneumoperitoneum. Patchy opacity at the right lung base, unchanged. IMPRESSION: Enteric tube terminates in the gastric fundus. Gaseous distention  of small and large bowel loops throughout the upper abdomen, suggesting diffuse adynamic ileus. Stable patchy opacity at the right lung base. Electronically Signed   By: Ilona Sorrel M.D.   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 Patient is on comfort measures only.  # Anxiety shortness of breath- patient getting morphine as needed. He seems to be comfortable.  #Discussed with sons multiple family members.     Cammie Sickle, MD 07/18/2015  11:28 AM

## 2015-07-19 ENCOUNTER — Inpatient Hospital Stay: Payer: 59

## 2015-07-19 ENCOUNTER — Inpatient Hospital Stay: Payer: 59 | Admitting: Internal Medicine

## 2015-07-26 ENCOUNTER — Other Ambulatory Visit: Payer: 59

## 2015-07-26 NOTE — Progress Notes (Signed)
Follow up visit to new referral for [facility] services. Patient seen lying in bed, audible secretions noted. staff RN Ok Edwards aware and has given medications as ordered. Patient's wife Mitchell Nguyen and other family present at bedside. Mitchell Nguyen has requested that Mitchell Nguyen not be moved, she feels that the hospital staff is taking very good care of him and "have been wonderful and sweet". Writer assured Mitchell Nguyen that this was their decision. End of life education and emotional support provided. Attending physician Dr. Lisbeth Renshaw into the room during visit, she is aware that patient has required 4 doses of PRN IV morphine at 4 mg each since 2 am, for dyspnea and pain. This is in addition to the scheduled 2 mg every 6 hours he is receiving. Discussed use of continuous morphine drip for symptom managment, family wants "him comfortable".  They are agreeable if needed. Hospital care team aware of family choice for aptient to remain at [facility] for end of life care. Thank you. Flo Shanks RN,BSN, [facility] [place], [facility] [phone]

## 2015-07-26 NOTE — Progress Notes (Addendum)
[place] [facility] [place] NAME: Mitchell Nguyen    MR#:  [id number]  DATE OF BIRTH:  April 19, 1945  SUBJECTIVE:   Patient requiring increased amounts of morphine for comfort REVIEW OF SYSTEMS:  Review of Systems  Unable to perform ROS: medical condition   DRUG ALLERGIES:   Allergies  Allergen Reactions  . Lisinopril Anaphylaxis    VITALS:  Blood pressure 115/53, pulse 104, temperature 96.7 F (35.9 C), temperature source Axillary, resp. rate 18, height 6' (1.829 m), weight 64.8 kg (142 lb 13.7 oz), SpO2 92 %.  PHYSICAL EXAMINATION:  Physical Exam  Constitutional: He is well-developed, well-nourished, and in no distress. No distress.  frail  HENT:  Head: Normocephalic.  Eyes: No scleral icterus.  Neck: Normal range of motion. Neck supple. No JVD present. No tracheal deviation present.  Cardiovascular: Normal heart sounds.  Exam reveals no gallop and no friction rub.   No murmur heard. Tachy irr, reg  Pulmonary/Chest: He is in respiratory distress. He has wheezes. He has rales. He exhibits no tenderness.  increased work of breathing  Abdominal: Soft. Bowel sounds are normal. He exhibits no distension and no mass. There is no tenderness. There is no rebound and no guarding.  Musculoskeletal: Normal range of motion. He exhibits no edema.  Skin: Skin is warm. No rash noted. No erythema.    G  LABORATORY PANEL:   CBC  Recent Labs Lab 07/17/15 0410  WBC 0.7*  HGB 10.0*  HCT 29.5*  PLT 34*   ------------------------------------------------------------------------------------------------------------------  Chemistries   Recent Labs Lab 07/17/15 0410  NA 137  K 5.1  CL 104  CO2 20*  GLUCOSE 164*  BUN 124*  CREATININE 3.19*  CALCIUM 6.4*  MG 2.5*   ------------------------------------------------------------------------------------------------------------------  Cardiac Enzymes  Recent Labs Lab 06/28/2015 1122   TROPONINI 0.11*   ------------------------------------------------------------------------------------------------------------------  RADIOLOGY:  No results found. 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  #1 Acute hypoxic respiratory failure- secondary to pneumonitis and also healthcare acquired pneumonia-initially required BiPAP  Patient on comfort measures   #2 Afib with rvr #3 Pancytopenia #4 Metastatic squamous cell cell lung cancer-lung cancer with spread to bones and also small brain metastases noted. #5 ARF with anuria- thought to be due to ATN  #6 Abdominal distention-     Patient is on comfort measures. Consider morphine gtt if IV morphine not working  Management plans discussed with the family and they are in agreement. D/w HOSPICE nurse.,Mitchell Nguyen   CODE STATUS: DNR  TOTAL TIME SPENT IN TAKING  CARE OF THIS PATIENT: 20 minutes.   Guarded prognosis overall.  Mitchell Nguyen M.D on 08-12-2015 at 11:30 AM  Between 7am to 6pm - Pager - [phone] After 6pm go to [email or web address] - password [email or web address] [facility]  Office  [phone]  CC: Primary care physician; No PCP Per Patient

## 2015-07-26 NOTE — Progress Notes (Signed)
Palliative Medicine Inpatient Consult Follow Up Note   Name: Mitchell Nguyen Date: 2015-07-28 MRN: [id number]  DOB: [birth year]  Referring Physician: Bettey Costa, MD  Palliative Care consult requested for this 70 y.o. male for goals of medical therapy in patient with lung cancer   TODAY: I was nearby and was asked to come into the room as patient was only having rare breaths.  I noted rare breaths and left the room to get a stethoscope to examine pt.  On returning, he had just taken his last breath as I entered the room at 2:30. See separate note re: death pronouncement.  Supportive conversation given.  CODE STATUS: DNR   PAST MEDICAL HISTORY: Past Medical History  Diagnosis Date  . Hypertension   . MI (myocardial infarction) ([place])     x2 (2012 and 2014) at [facility]  . Hyperlipidemia   . Lung mass   . COPD (chronic obstructive pulmonary disease) ([place])   . Cough   . Emphysema of lung ([place])   . Shortness of breath   . Constipation   . Arthritis   . Low back pain   . ST elevation myocardial infarction (STEMI) ([place])   . History of tobacco abuse   . Hx of cardiac cath     PAST SURGICAL HISTORY:  Past Surgical History  Procedure Laterality Date  . Cardiac surgery      stents placed x2  . Cardiac catheterization    . Peripheral vascular catheterization N/A 06/28/2015    Procedure: Glori Luis Cath Insertion;  Surgeon: Algernon Huxley, MD;  Location: [facility];  Service: Cardiovascular;  Laterality: N/A;  . Cardiac catheterization N/A 07/25/2015    Procedure: Left Heart Cath and Coronary Angiography;  Surgeon: Yolonda Kida, MD;  Location: [facility];  Service: Cardiovascular;  Laterality: N/A;    Vital Signs: BP 115/53 mmHg  Pulse 104  Temp(Src) 96.7 F (35.9 C) (Axillary)  Resp 18  Ht 6' (1.829 m)  Wt 64.8 kg (142 lb 13.7 oz)  BMI 19.37 kg/m2  SpO2 92% Filed Weights   07/15/15 0500 07/16/15 0500 07/17/15 0417  Weight: 70.2 kg (154 lb 12.2 oz) 69.3  kg (152 lb 12.5 oz) 64.8 kg (142 lb 13.7 oz)    Estimated body mass index is 19.37 kg/(m^2) as calculated from the following:   Height as of this encounter: 6' (1.829 m).   Weight as of this encounter: 64.8 kg (142 lb 13.7 oz).  PHYSICAL EXAM: Rare deep and sometimes shallow respirations noted Eyes closed No JVD or TM Hrt rrr with irreg beats --distant Pulses palpable in wrists Lungs with distant BS Abd soft and NT Ext motling noted (faint)   More than 50% of the visit was spent in counseling/coordination of care: YES  Time Spent:  15 min

## 2015-07-26 NOTE — Plan of Care (Signed)
Problem: Role Relationship: Goal: Ability to verbalize concerns, feelings, and thoughts to partner or family member will improve Outcome: Not Progressing Pt unresponsive

## 2015-07-26 NOTE — Discharge Summary (Signed)
Mr Mitchell Nguyen with history if metastatic squamous cell carcinoma of the lung with bone metastases,  COPD, CAD status post prior stents was admitted to the hospital secondary to acute respiratory failure.  #1 Acute hypoxic respiratory failure- secondary to pneumonitis and also healthcare acquired pneumonia-initially required BiPAP  Patient was on comfort measures and expired on 4/24.at 1430. Family was at bedside.  #2 Afib with rvr #3 Pancytopenia #4 Metastatic squamous cell cell lung cancer-lung cancer with spread to bones and also small brain metastases noted. #5 ARF with anuria- thought to be due to ATN  #6 Abdominal distention-

## 2015-07-26 NOTE — Progress Notes (Signed)
   08-04-15 1445  Clinical Encounter Type  Visited With Patient and family together  Visit Type Death  Referral From Nurse  Consult/Referral To Chaplain  Spiritual Encounters  Spiritual Needs Emotional;Grief support;Prayer  Stress Factors  Patient Stress Factors Other (Comment) (Death)  Family Stress Factors Loss  Responded to request for grief support for Rien's death. Provided comfort care & prayer for family. Chap. Shyniece Scripter G. [place]

## 2015-07-26 NOTE — Care Management Important Message (Signed)
Important Message  Patient Details  Name: Mitchell Nguyen MRN: [id number] Date of Birth: 09/09/1945   Medicare Important Message Given:  Yes    [name] 2015-08-07, 10:40 AM

## 2015-07-26 NOTE — Progress Notes (Signed)
Patient placed in body bag and eye procurement completed by [facility] RN. Patient taken to morgue by transport staff.

## 2015-07-26 NOTE — Progress Notes (Signed)
Death Pronouncement   I was in the room just as pt took last breath. Then, on exam, he has no breath sounds, no respirations, no heart sounds, no pulses, and pupils were fixed,disconjugate and dilated.    He is pronounced dead at 2:30 pm on this date.     Colleen Can, MD Palliative Care Physician

## 2015-07-26 DEATH — deceased

## 2015-07-30 ENCOUNTER — Ambulatory Visit: Payer: 59

## 2015-07-30 ENCOUNTER — Ambulatory Visit: Payer: 59 | Admitting: Internal Medicine

## 2015-07-30 ENCOUNTER — Other Ambulatory Visit: Payer: 59

## 2015-08-06 ENCOUNTER — Ambulatory Visit: Payer: 59 | Admitting: Radiation Oncology

## 2015-09-10 ENCOUNTER — Other Ambulatory Visit: Payer: Self-pay | Admitting: Nurse Practitioner

## 2017-10-29 IMAGING — MR MR HEAD WO/W CM
10 of 13 series · 36 of 48 positions shown · IV contrast (multihance)
Comparison: PET-CT 06/04/2015

CLINICAL DATA: 69-year-old male with headache and blurred vision
since [REDACTED]. Recently diagnosed right middle lobe mass with right
adrenal and bone metastases. Staging. Subsequent encounter.

EXAM:
MRI HEAD WITHOUT AND WITH CONTRAST
TECHNIQUE: Multiplanar, multiecho pulse sequences of the brain and surrounding
structures were obtained without and with intravenous contrast.
CONTRAST:  15mL MULTIHANCE GADOBENATE DIMEGLUMINE 529 MG/ML IV SOLN

[Series 4: DWI · axial · 4.0mm · 0.94mm/px · z∈[-63,+105]mm · 4 of 43 slices shown (1 of 4)]
[im 1/43]
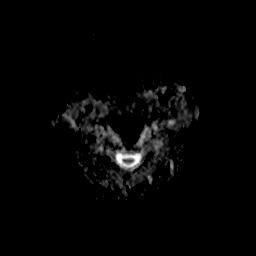
[im 15/43]
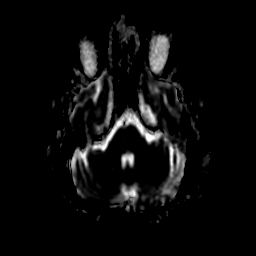
[im 29/43]
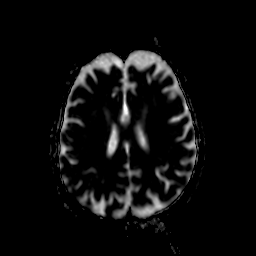
[im 43/43]
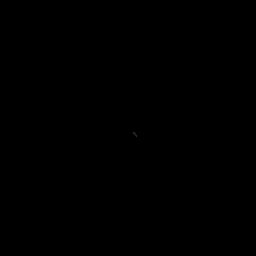

[Series 6: DWI · coronal · 5.0mm · 1.80mm/px · 4 of 38 slices shown (2 of 4)]
[im 1/38]
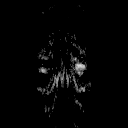
[im 13/38]
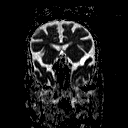
[im 25/38]
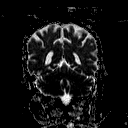
[im 38/38]
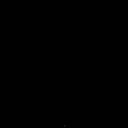

[Series 7: DWI · axial · 4.0mm · 0.94mm/px · z∈[-63,+105]mm · 4 of 43 slices shown (3 of 4)]
[im 1/43]
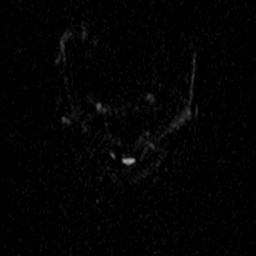
[im 15/43]
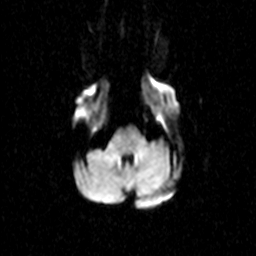
[im 29/43]
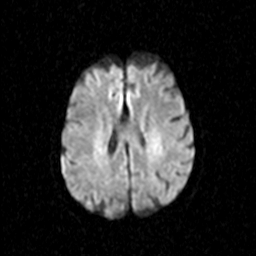
[im 43/43]
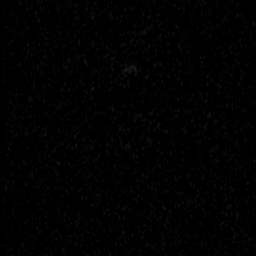

[Series 8: DWI · coronal · 5.0mm · 1.80mm/px · 4 of 36 slices shown (4 of 4)]
[im 1/36]
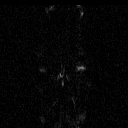
[im 12/36]
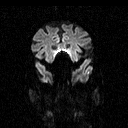
[im 24/36]
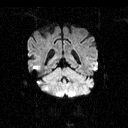
[im 36/36]
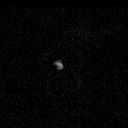

[Series 9: T2 · axial · 5.0mm · 0.45mm/px · z∈[-46,+109]mm · 3 of 25 slices shown (1 of 2)]
[im 1/25]
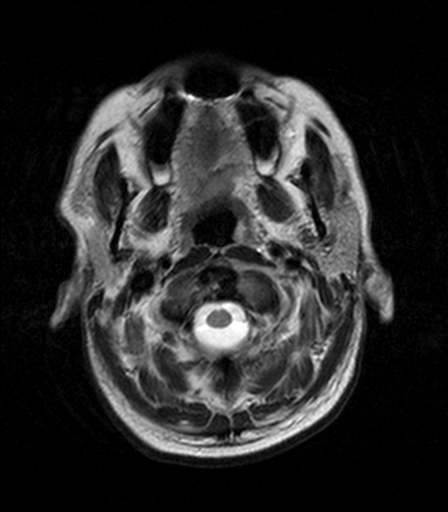
[im 13/25]
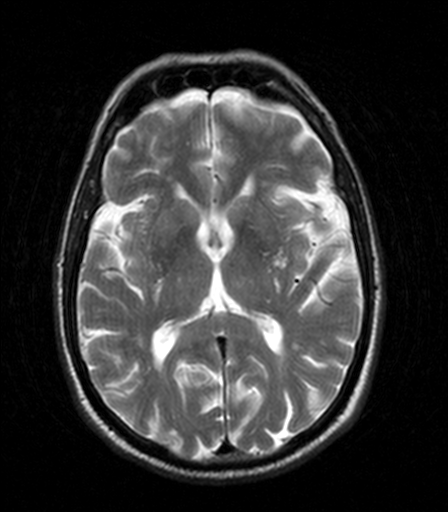
[im 25/25]
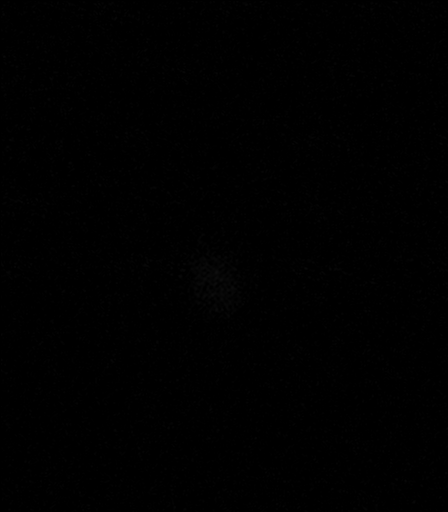

[Series 10: FLAIR · axial · 5.0mm · 0.90mm/px · z∈[-46,+110]mm · 3 of 25 slices shown]
[im 1/25]
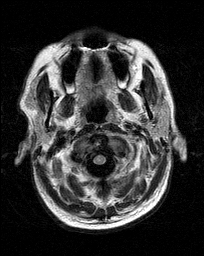
[im 13/25]
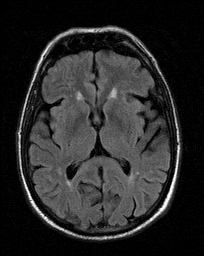
[im 25/25]
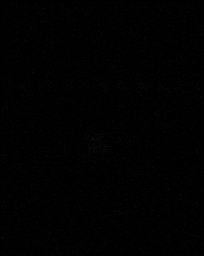

[Series 11: T2 · axial · 5.0mm · 0.45mm/px · z∈[-46,+32]mm · 2 of 25 slices shown (2 of 2)]
[im 1/25]
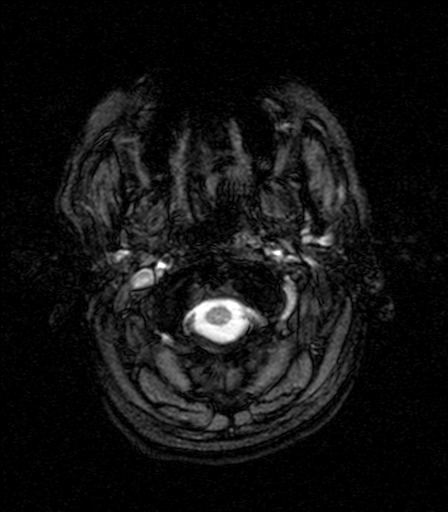
[im 13/25]
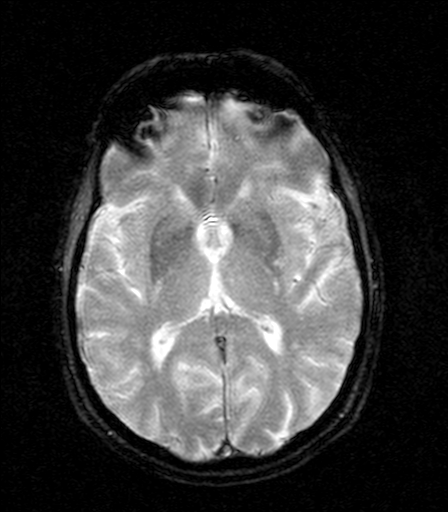

[Series 14: T1 post-contrast · axial · 3.0mm · 0.45mm/px · z∈[-53,+111]mm · 6 of 56 slices shown (1 of 3)]
[im 1/56]
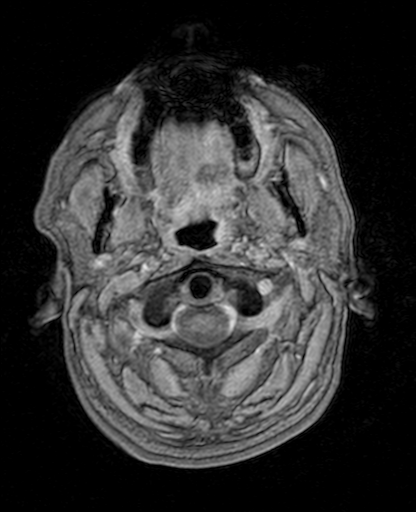
[im 12/56]
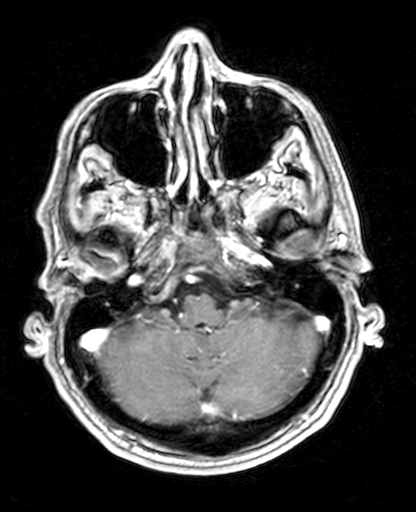
[im 23/56]
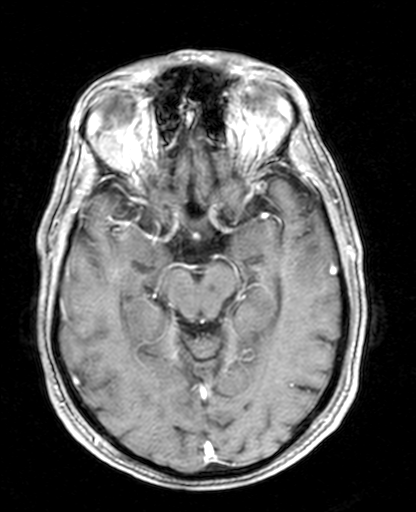
[im 34/56]
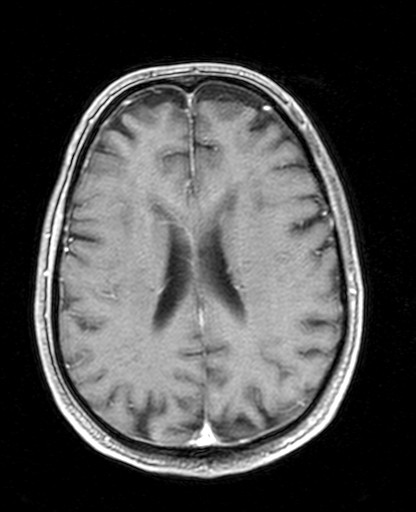
[im 45/56]
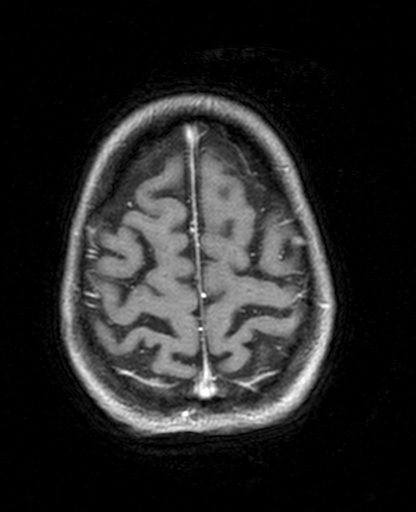
[im 56/56]
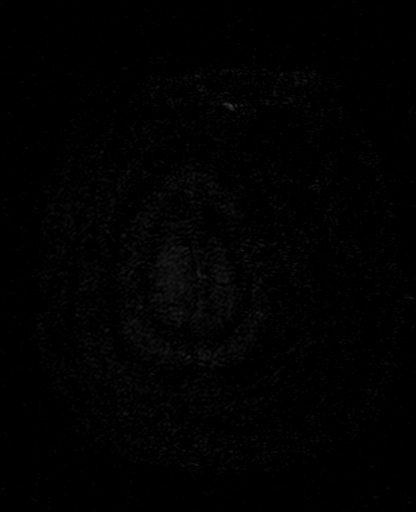

[Series 15: T1 post-contrast · coronal · 5.0mm · 0.45mm/px · 3 of 29 slices shown (2 of 3)]
[im 1/29]
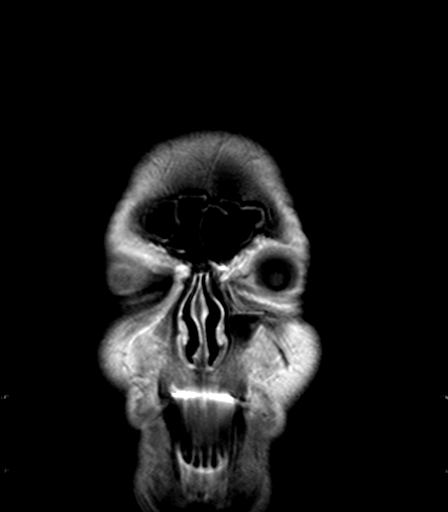
[im 15/29]
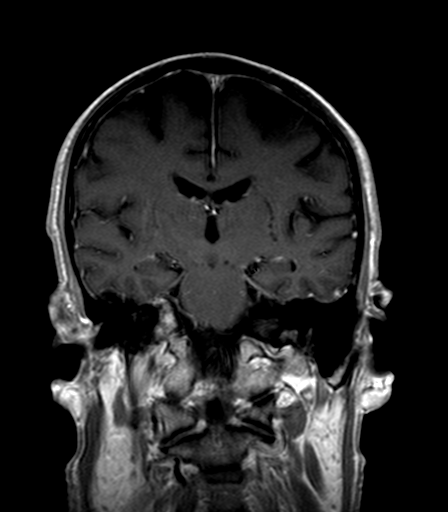
[im 29/29]
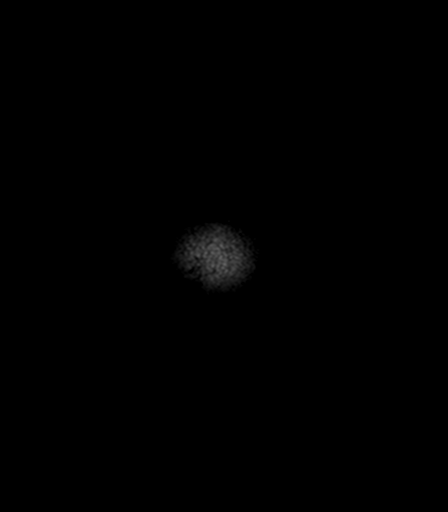

[Series 16: T1 post-contrast · sagittal · 5.0mm · 0.45mm/px · 3 of 27 slices shown (3 of 3)]
[im 1/27]
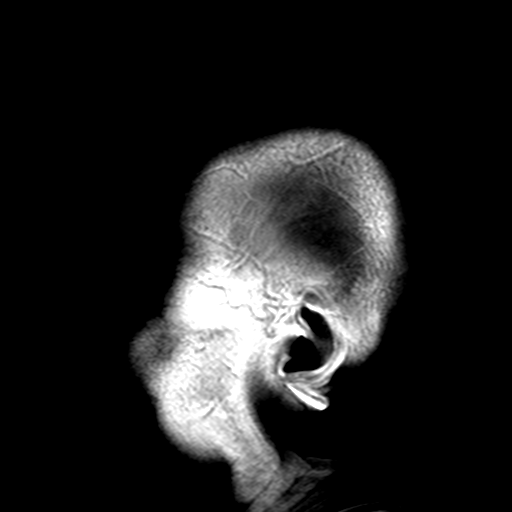
[im 14/27]
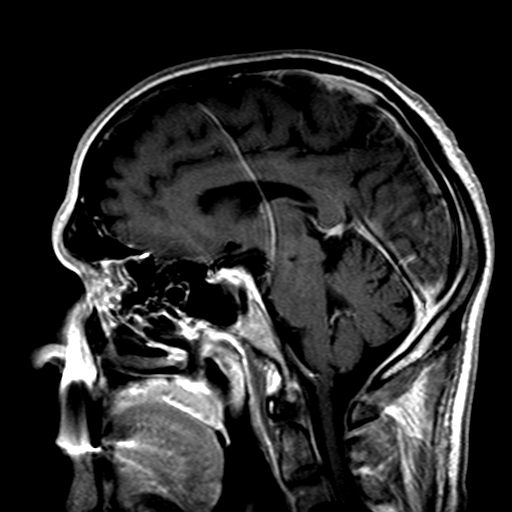
[im 27/27]
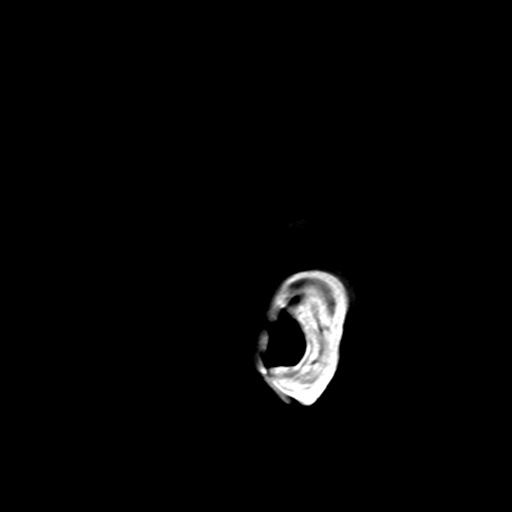

[36 of 48 positions shown; findings below may reference images not displayed]

FINDINGS: Small 3-4 mm round enhancing lesion in the left parietal lobe just
posterior to the sensory strip on series 14, image 42. Mild
associated vasogenic edema (series 10, image 19) with no associated
mass effect. Associated minimal blood products (series 11, image 19)
without acute hemorrhage.

No other brain metastasis identified. No other abnormal intracranial
enhancement. No dural thickening identified.

No destructive osseous lesion of the skull. Bone marrow signal at
the skullbase is within normal limits. Heterogeneously decreased T1
marrow signal in the visualized upper cervical spine, but no
destructive upper cervical osseous lesion identified. Negative
visualized cervical spinal cord.

No restricted diffusion to suggest acute infarction. No midline
shift, ventriculomegaly, extra-axial collection or acute
intracranial hemorrhage. Cervicomedullary junction and pituitary are
within normal limits. Major intracranial vascular flow voids are
within normal limits. Scattered mild for age nonspecific cerebral
white matter T2 and FLAIR hyperintensity, most commonly due to
chronic small vessel disease. No cerebral blood products other than
that associated with the small left parietal lobe metastasis.

Visible internal auditory structures appear normal. Mastoids are
clear. Paranasal sinuses are clear. Negative orbit and scalp soft
tissues.
IMPRESSION: 1. Positive for early metastatic disease to the brain with a
solitary 4 mm left parietal lobe metastasis identified. Mild
vasogenic edema without mass effect.
2. No osseous metastatic involvement of the skull identified. No
other acute intracranial abnormality identified.

## 2017-11-03 IMAGING — CT CT BIOPSY
1 of 3 series · 14 of 32 positions shown, 19 images · non-contrast
Comparison: none

INDICATION: 69-year-old male with new diagnosis of lung mass and multifocal
metastatic disease. He presents for CT-guided biopsy to confirm
tissue diagnosis.

[Series 2: routine abdomen · axial · 0.67mm/px · z∈[+342,+502]mm · 14 of 38 slices shown, 19 images]
[im 3/38  soft-tissue]
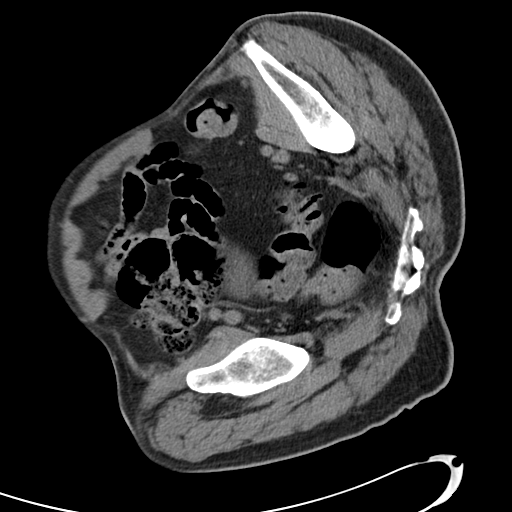
[im 3/38  bone]
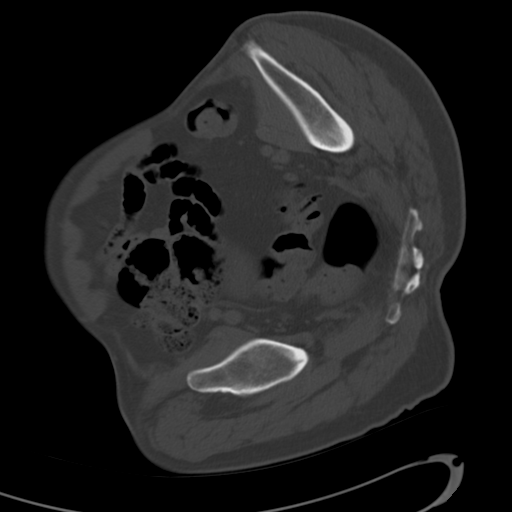
[im 5/38  soft-tissue]
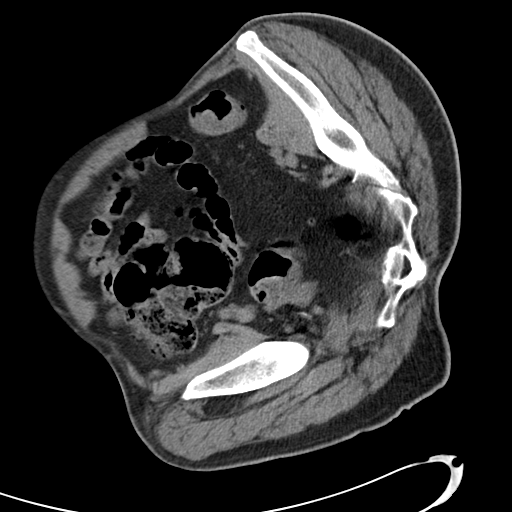
[im 9/38  soft-tissue]
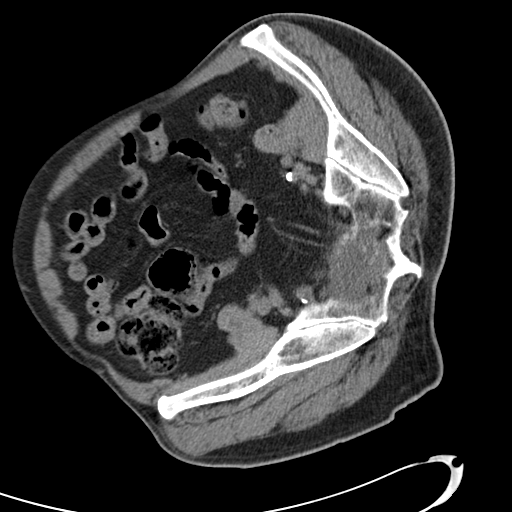
[im 11/38  soft-tissue]
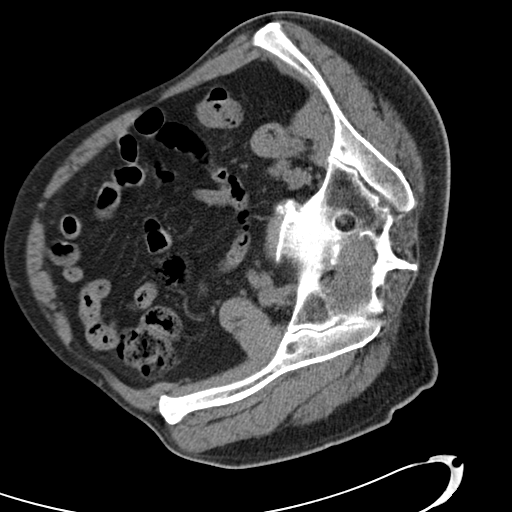
[im 13/38  soft-tissue]
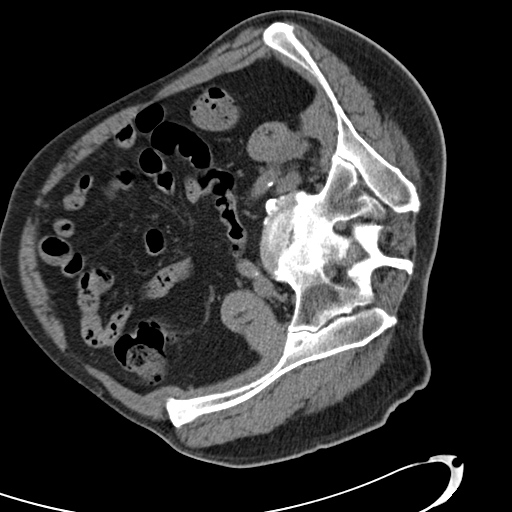
[im 17/38  soft-tissue]
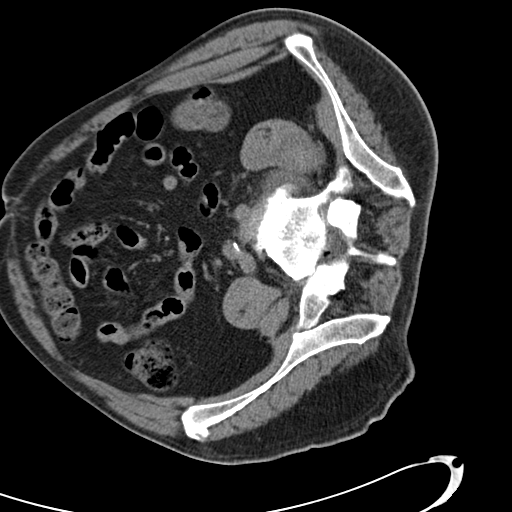
[im 19/38  soft-tissue]
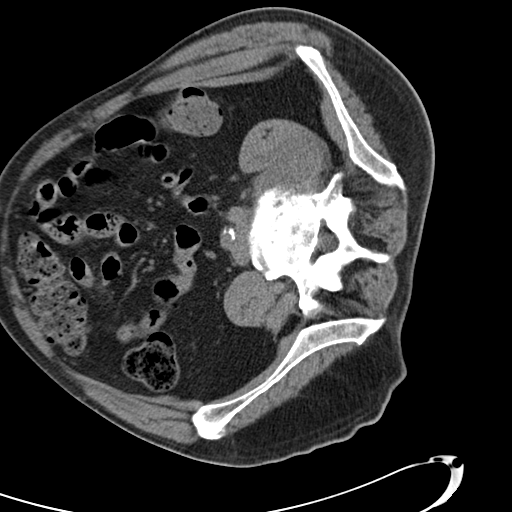
[im 21/38  soft-tissue]
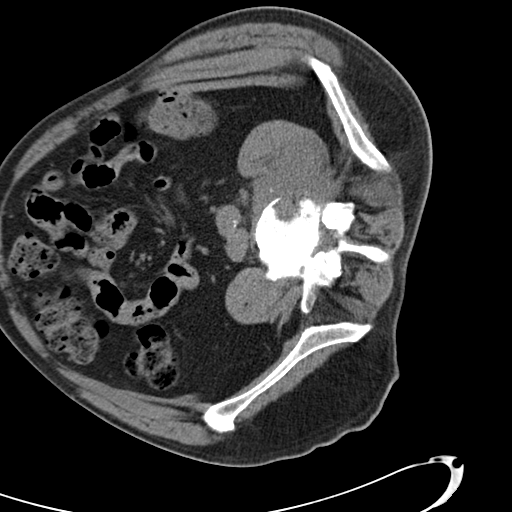
[im 25/38  soft-tissue]
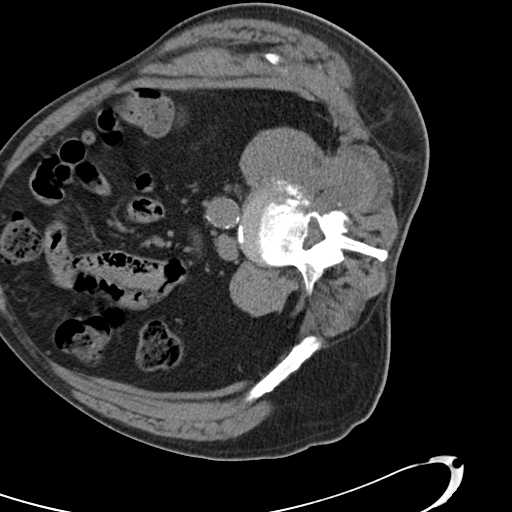
[im 25/38  bone]
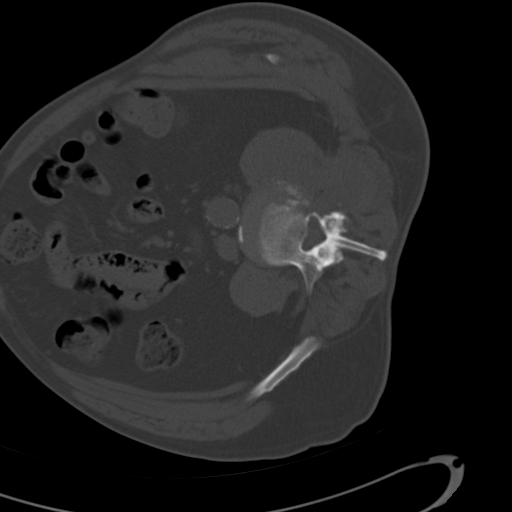
[im 27/38  soft-tissue]
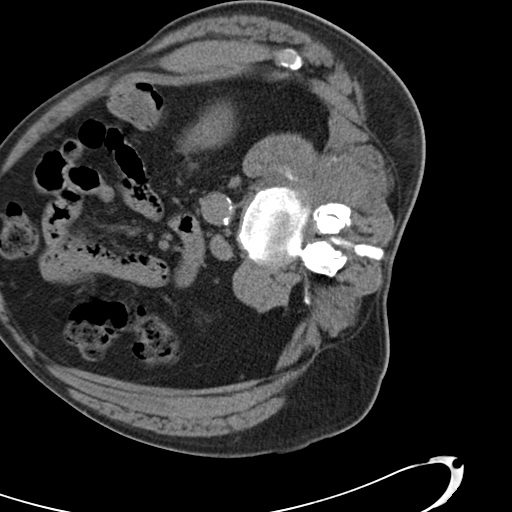
[im 29/38  soft-tissue]
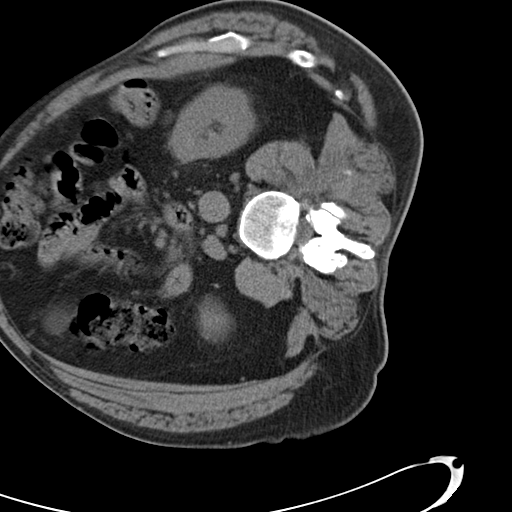
[im 29/38  lung]
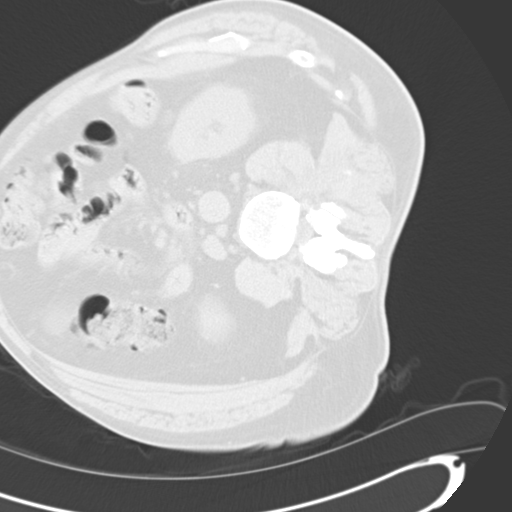
[im 31/38  lung]
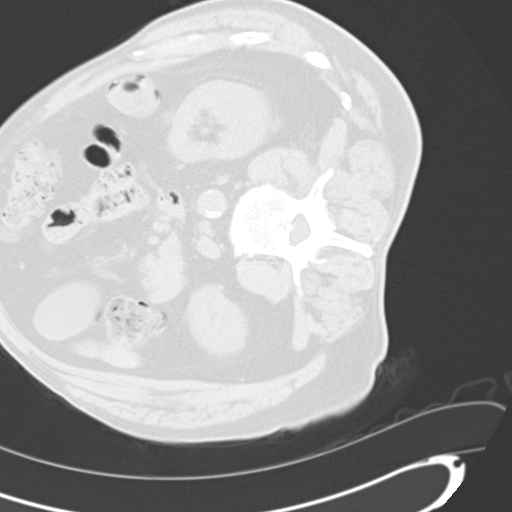
[im 33/38  soft-tissue]
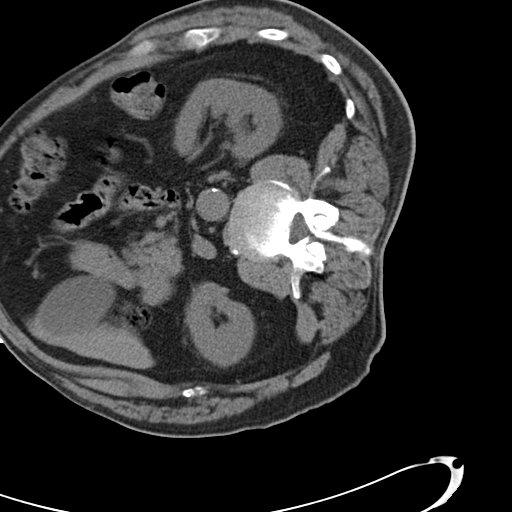
[im 33/38  lung]
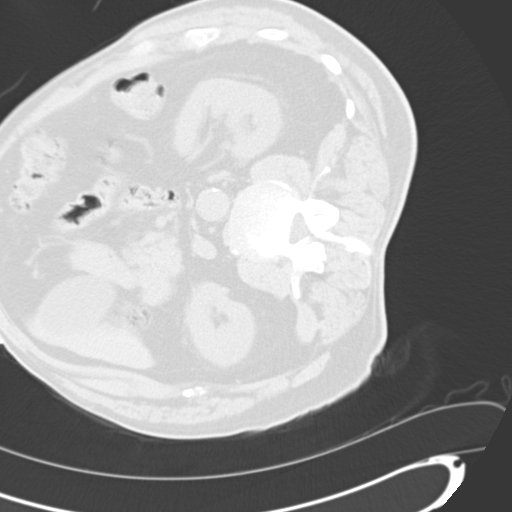
[im 35/38  soft-tissue]
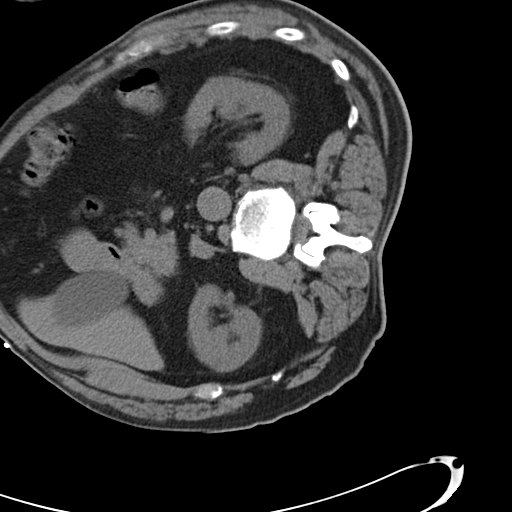
[im 35/38  lung]
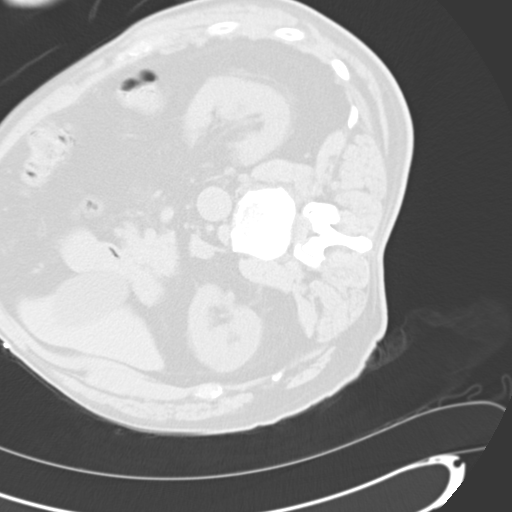

[14 of 32 positions shown; findings below may reference images not displayed]

EXAM:
CT BIOPSY

MEDICATIONS:
None.

ANESTHESIA/SEDATION:
Moderate (conscious) sedation was employed during this procedure. A
total of Versed 3 mg and Fentanyl 75 mcg was administered
intravenously.

Moderate Sedation Time: 12 minutes. The patient's level of
consciousness and vital signs were monitored continuously by
radiology nursing throughout the procedure under my direct
supervision.

FLUOROSCOPY TIME:  None

COMPLICATIONS:
None immediate.

PROCEDURE:
Informed written consent was obtained from the patient after a
thorough discussion of the procedural risks, benefits and
alternatives. All questions were addressed. Maximal Sterile Barrier
Technique was utilized including caps, mask, sterile gowns, sterile
gloves, sterile drape, hand hygiene and skin antiseptic. A timeout
was performed prior to the initiation of the procedure.

A planning axial CT scan was performed. The soft tissue mass in the
left paraspinal muscles at L4 is successfully identified. A suitable
skin entry site was selected and marked. Local anesthesia was
attained by infiltration with 1% lidocaine. Under intermittent CT
fluoroscopic guidance, a 19 gauge introducer needle was advanced
into the margin of the soft tissue mass. Multiple 18 gauge core
biopsies were then coaxially obtained using the Arissa Billiot automated
biopsy device. Biopsy specimens were placed in formalin and
delivered to pathology for further analysis.

Post biopsy axial CT imaging demonstrates no evidence of hematoma or
other acute complication.
IMPRESSION: Technically successful CT-guided core biopsy of left L4 paraspinal
soft tissue mass.
